# Patient Record
Sex: Female | Born: 1975
Health system: Southern US, Community
[De-identification: ages and names within clinical notes are randomized; demographics above are authoritative.]

## PROBLEM LIST (undated history)

## (undated) DIAGNOSIS — D509 Iron deficiency anemia, unspecified: Secondary | ICD-10-CM

## (undated) DIAGNOSIS — G039 Meningitis, unspecified: Secondary | ICD-10-CM

## (undated) DIAGNOSIS — F32A Depression, unspecified: Secondary | ICD-10-CM

## (undated) DIAGNOSIS — F419 Anxiety disorder, unspecified: Secondary | ICD-10-CM

## (undated) DIAGNOSIS — T7840XA Allergy, unspecified, initial encounter: Secondary | ICD-10-CM

## (undated) DIAGNOSIS — F909 Attention-deficit hyperactivity disorder, unspecified type: Secondary | ICD-10-CM

## (undated) DIAGNOSIS — S92919A Unspecified fracture of unspecified toe(s), initial encounter for closed fracture: Secondary | ICD-10-CM

## (undated) DIAGNOSIS — G43909 Migraine, unspecified, not intractable, without status migrainosus: Secondary | ICD-10-CM

## (undated) DIAGNOSIS — F329 Major depressive disorder, single episode, unspecified: Secondary | ICD-10-CM

## (undated) HISTORY — DX: Iron deficiency anemia, unspecified: D50.9

## (undated) HISTORY — DX: Major depressive disorder, single episode, unspecified: F32.9

## (undated) HISTORY — DX: Attention-deficit hyperactivity disorder, unspecified type: F90.9

## (undated) HISTORY — DX: Depression, unspecified: F32.A

## (undated) HISTORY — DX: Meningitis, unspecified: G03.9

## (undated) HISTORY — DX: Allergy, unspecified, initial encounter: T78.40XA

## (undated) HISTORY — DX: Anxiety disorder, unspecified: F41.9

## (undated) HISTORY — DX: Migraine, unspecified, not intractable, without status migrainosus: G43.909

## (undated) HISTORY — DX: Unspecified fracture of unspecified toe(s), initial encounter for closed fracture: S92.919A

---

## 2000-11-24 HISTORY — PX: BREAST SURGERY: SHX581

## 2008-04-04 ENCOUNTER — Other Ambulatory Visit: Admission: RE | Admit: 2008-04-04 | Discharge: 2008-04-04 | Payer: Self-pay | Admitting: Obstetrics and Gynecology

## 2015-10-25 ENCOUNTER — Encounter: Payer: Self-pay | Admitting: Family Medicine

## 2015-10-25 ENCOUNTER — Other Ambulatory Visit: Payer: Self-pay

## 2015-10-25 ENCOUNTER — Ambulatory Visit (INDEPENDENT_AMBULATORY_CARE_PROVIDER_SITE_OTHER): Payer: BLUE CROSS/BLUE SHIELD | Admitting: Family Medicine

## 2015-10-25 VITALS — BP 105/72 | HR 76 | Temp 98.8°F | Resp 18 | Ht 67.5 in | Wt 188.0 lb

## 2015-10-25 DIAGNOSIS — F902 Attention-deficit hyperactivity disorder, combined type: Secondary | ICD-10-CM

## 2015-10-25 DIAGNOSIS — F909 Attention-deficit hyperactivity disorder, unspecified type: Secondary | ICD-10-CM | POA: Insufficient documentation

## 2015-10-25 DIAGNOSIS — Z0000 Encounter for general adult medical examination without abnormal findings: Secondary | ICD-10-CM

## 2015-10-25 DIAGNOSIS — Z1231 Encounter for screening mammogram for malignant neoplasm of breast: Secondary | ICD-10-CM

## 2015-10-25 DIAGNOSIS — R5383 Other fatigue: Secondary | ICD-10-CM | POA: Diagnosis not present

## 2015-10-25 DIAGNOSIS — Z1239 Encounter for other screening for malignant neoplasm of breast: Secondary | ICD-10-CM

## 2015-10-25 DIAGNOSIS — Z7189 Other specified counseling: Secondary | ICD-10-CM | POA: Diagnosis not present

## 2015-10-25 DIAGNOSIS — E669 Obesity, unspecified: Secondary | ICD-10-CM | POA: Insufficient documentation

## 2015-10-25 DIAGNOSIS — Z7689 Persons encountering health services in other specified circumstances: Secondary | ICD-10-CM | POA: Insufficient documentation

## 2015-10-25 DIAGNOSIS — Z6828 Body mass index (BMI) 28.0-28.9, adult: Secondary | ICD-10-CM

## 2015-10-25 MED ORDER — AMPHETAMINE-DEXTROAMPHETAMINE 30 MG PO TABS: 30.0000 mg | ORAL_TABLET | Freq: Two times a day (BID) | ORAL | Status: DC

## 2015-10-25 MED ORDER — CITALOPRAM HYDROBROMIDE 20 MG PO TABS: 20.0000 mg | ORAL_TABLET | Freq: Every day | ORAL | Status: DC

## 2015-10-25 MED ORDER — AMPHETAMINE-DEXTROAMPHETAMINE 30 MG PO TABS
30.0000 mg | ORAL_TABLET | Freq: Two times a day (BID) | ORAL | Status: DC
Start: 2015-10-25 — End: 2015-10-25

## 2015-10-25 NOTE — Patient Instructions (Signed)
Health Maintenance, Female Adopting a healthy lifestyle and getting preventive care can go a long way to promote health and wellness. Talk with your health care provider about what schedule of regular examinations is right for you. This is a good chance for you to check in with your provider about disease prevention and staying healthy. In between checkups, there are plenty of things you can do on your own. Experts have done a lot of research about which lifestyle changes and preventive measures are most likely to keep you healthy. Ask your health care provider for more information. WEIGHT AND DIET  Eat a healthy diet  Be sure to include plenty of vegetables, fruits, low-fat dairy products, and lean protein.  Do not eat a lot of foods high in solid fats, added sugars, or salt.  Get regular exercise. This is one of the most important things you can do for your health.  Most adults should exercise for at least 150 minutes each week. The exercise should increase your heart rate and make you sweat (moderate-intensity exercise).  Most adults should also do strengthening exercises at least twice a week. This is in addition to the moderate-intensity exercise.  Maintain a healthy weight  Body mass index (BMI) is a measurement that can be used to identify possible weight problems. It estimates body fat based on height and weight. Your health care provider can help determine your BMI and help you achieve or maintain a healthy weight.  For females 20 years of age and older:   A BMI below 18.5 is considered underweight.  A BMI of 18.5 to 24.9 is normal.  A BMI of 25 to 29.9 is considered overweight.  A BMI of 30 and above is considered obese.  Watch levels of cholesterol and blood lipids  You should start having your blood tested for lipids and cholesterol at 41 years of age, then have this test every 5 years.  You may need to have your cholesterol levels checked more often if:  Your lipid  or cholesterol levels are high.  You are older than 41 years of age.  You are at high risk for heart disease.  CANCER SCREENING   Lung Cancer  Lung cancer screening is recommended for adults 55-80 years old who are at high risk for lung cancer because of a history of smoking.  A yearly low-dose CT scan of the lungs is recommended for people who:  Currently smoke.  Have quit within the past 15 years.  Have at least a 30-pack-year history of smoking. A pack year is smoking an average of one pack of cigarettes a day for 1 year.  Yearly screening should continue until it has been 15 years since you quit.  Yearly screening should stop if you develop a health problem that would prevent you from having lung cancer treatment.  Breast Cancer  Practice breast self-awareness. This means understanding how your breasts normally appear and feel.  It also means doing regular breast self-exams. Let your health care provider know about any changes, no matter how small.  If you are in your 20s or 30s, you should have a clinical breast exam (CBE) by a health care provider every 1-3 years as part of a regular health exam.  If you are 40 or older, have a CBE every year. Also consider having a breast X-ray (mammogram) every year.  If you have a family history of breast cancer, talk to your health care provider about genetic screening.  If you   are at high risk for breast cancer, talk to your health care provider about having an MRI and a mammogram every year.  Breast cancer gene (BRCA) assessment is recommended for women who have family members with BRCA-related cancers. BRCA-related cancers include:  Breast.  Ovarian.  Tubal.  Peritoneal cancers.  Results of the assessment will determine the need for genetic counseling and BRCA1 and BRCA2 testing. Cervical Cancer Your health care provider may recommend that you be screened regularly for cancer of the pelvic organs (ovaries, uterus, and  vagina). This screening involves a pelvic examination, including checking for microscopic changes to the surface of your cervix (Pap test). You may be encouraged to have this screening done every 3 years, beginning at age 21.  For women ages 30-65, health care providers may recommend pelvic exams and Pap testing every 3 years, or they may recommend the Pap and pelvic exam, combined with testing for human papilloma virus (HPV), every 5 years. Some types of HPV increase your risk of cervical cancer. Testing for HPV may also be done on women of any age with unclear Pap test results.  Other health care providers may not recommend any screening for nonpregnant women who are considered low risk for pelvic cancer and who do not have symptoms. Ask your health care provider if a screening pelvic exam is right for you.  If you have had past treatment for cervical cancer or a condition that could lead to cancer, you need Pap tests and screening for cancer for at least 20 years after your treatment. If Pap tests have been discontinued, your risk factors (such as having a new sexual partner) need to be reassessed to determine if screening should resume. Some women have medical problems that increase the chance of getting cervical cancer. In these cases, your health care provider may recommend more frequent screening and Pap tests. Colorectal Cancer  This type of cancer can be detected and often prevented.  Routine colorectal cancer screening usually begins at 41 years of age and continues through 41 years of age.  Your health care provider may recommend screening at an earlier age if you have risk factors for colon cancer.  Your health care provider may also recommend using home test kits to check for hidden blood in the stool.  A small camera at the end of a tube can be used to examine your colon directly (sigmoidoscopy or colonoscopy). This is done to check for the earliest forms of colorectal  cancer.  Routine screening usually begins at age 50.  Direct examination of the colon should be repeated every 5-10 years through 41 years of age. However, you may need to be screened more often if early forms of precancerous polyps or small growths are found. Skin Cancer  Check your skin from head to toe regularly.  Tell your health care provider about any new moles or changes in moles, especially if there is a change in a mole's shape or color.  Also tell your health care provider if you have a mole that is larger than the size of a pencil eraser.  Always use sunscreen. Apply sunscreen liberally and repeatedly throughout the day.  Protect yourself by wearing long sleeves, pants, a wide-brimmed hat, and sunglasses whenever you are outside. HEART DISEASE, DIABETES, AND HIGH BLOOD PRESSURE   High blood pressure causes heart disease and increases the risk of stroke. High blood pressure is more likely to develop in:  People who have blood pressure in the high end   of the normal range (130-139/85-89 mm Hg).  People who are overweight or obese.  People who are African American.  If you are 38-23 years of age, have your blood pressure checked every 3-5 years. If you are 61 years of age or older, have your blood pressure checked every year. You should have your blood pressure measured twice--once when you are at a hospital or clinic, and once when you are not at a hospital or clinic. Record the average of the two measurements. To check your blood pressure when you are not at a hospital or clinic, you can use:  An automated blood pressure machine at a pharmacy.  A home blood pressure monitor.  If you are between 45 years and 39 years old, ask your health care provider if you should take aspirin to prevent strokes.  Have regular diabetes screenings. This involves taking a blood sample to check your fasting blood sugar level.  If you are at a normal weight and have a low risk for diabetes,  have this test once every three years after 41 years of age.  If you are overweight and have a high risk for diabetes, consider being tested at a younger age or more often. PREVENTING INFECTION  Hepatitis B  If you have a higher risk for hepatitis B, you should be screened for this virus. You are considered at high risk for hepatitis B if:  You were born in a country where hepatitis B is common. Ask your health care provider which countries are considered high risk.  Your parents were born in a high-risk country, and you have not been immunized against hepatitis B (hepatitis B vaccine).  You have HIV or AIDS.  You use needles to inject street drugs.  You live with someone who has hepatitis B.  You have had sex with someone who has hepatitis B.  You get hemodialysis treatment.  You take certain medicines for conditions, including cancer, organ transplantation, and autoimmune conditions. Hepatitis C  Blood testing is recommended for:  Everyone born from [birth year] through 1967.  Anyone with known risk factors for hepatitis C. Sexually transmitted infections (STIs)  You should be screened for sexually transmitted infections (STIs) including gonorrhea and chlamydia if:  You are sexually active and are younger than 41 years of age.  You are older than 41 years of age and your health care provider tells you that you are at risk for this type of infection.  Your sexual activity has changed since you were last screened and you are at an increased risk for chlamydia or gonorrhea. Ask your health care provider if you are at risk.  If you do not have HIV, but are at risk, it may be recommended that you take a prescription medicine daily to prevent HIV infection. This is called pre-exposure prophylaxis (PrEP). You are considered at risk if:  You are sexually active and do not regularly use condoms or know the HIV status of your partner(s).  You take drugs by injection.  You are sexually  active with a partner who has HIV. Talk with your health care provider about whether you are at high risk of being infected with HIV. If you choose to begin PrEP, you should first be tested for HIV. You should then be tested every 3 months for as long as you are taking PrEP.  PREGNANCY   If you are premenopausal and you may become pregnant, ask your health care provider about preconception counseling.  If you may  become pregnant, take 400 to 800 micrograms (mcg) of folic acid every day.  If you want to prevent pregnancy, talk to your health care provider about birth control (contraception). OSTEOPOROSIS AND MENOPAUSE   Osteoporosis is a disease in which the bones lose minerals and strength with aging. This can result in serious bone fractures. Your risk for osteoporosis can be identified using a bone density scan.  If you are 73 years of age or older, or if you are at risk for osteoporosis and fractures, ask your health care provider if you should be screened.  Ask your health care provider whether you should take a calcium or vitamin D supplement to lower your risk for osteoporosis.  Menopause may have certain physical symptoms and risks.  Hormone replacement therapy may reduce some of these symptoms and risks. Talk to your health care provider about whether hormone replacement therapy is right for you.  HOME CARE INSTRUCTIONS   Schedule regular health, dental, and eye exams.  Stay current with your immunizations.   Do not use any tobacco products including cigarettes, chewing tobacco, or electronic cigarettes.  If you are pregnant, do not drink alcohol.  If you are breastfeeding, limit how much and how often you drink alcohol.  Limit alcohol intake to no more than 1 drink per day for nonpregnant women. One drink equals 12 ounces of beer, 5 ounces of wine, or 1 ounces of hard liquor.  Do not use street drugs.  Do not share needles.  Ask your health care provider for help if  you need support or information about quitting drugs.  Tell your health care provider if you often feel depressed.  Tell your health care provider if you have ever been abused or do not feel safe at home.   This information is not intended to replace advice given to you by your health care provider. Make sure you discuss any questions you have with your health care provider.   Document Released: 05/26/2011 Document Revised: 12/01/2014 Document Reviewed: 10/12/2013 Elsevier Interactive Patient Education 2016 [place] tomorrow, if normal will see in 3 months for ADHD.  mammogram ordered

## 2015-10-25 NOTE — Progress Notes (Signed)
Subjective:    Patient ID: Jenny Little, female    DOB: 11/02/1976, 39 y.o.   MRN: [id number]  HPI   Patient presents for new patient establishment with complaints of fatigue.. All past medical history, surgical history, allergies, family history, immunizations and social history was obtained from the patient today and entered into the electronic medical record. Records are requested from her prior PCP, and will be reviewed at the time they are received. All medical records will be updated at that time.  Health maintenance:  Colonoscopy: Family history maternal grandmother at age 41. Early screening indicated. Mammogram: Family history of breast cancer, patient is establishing with gynecology. Mammogram desired.  Cervical cancer screening: Patient is establishing with gynecology. Last Pap smear 2015. No abnormal Paps.  Immunizations: Immunizations unknown, records have been requested. Infectious disease screening: HIV screening unknown, records have been requested.  ADHD: Patient states that she was started on ADHD medications at age 89. She reports taking Adderall 30 mg twice a day. She states she was diagnosed with ADHD in eighth grade, but her mother never wanted her to be medicated. She initially was tried on Vyvanse, but secondary to insurance coverage this was discontinued and she was started on Adderall. She states she has no side effects to the Adderall, and she feels she benefits a great deal with the use of Adderall. Patients pharmacy was contacted for dose verification and last refill was 09/30/2015.  Fatigue: Patient reports increased fatigue over the last 2 weeks. She is starting a new job, and moved to a new town and home. She admits that she has not been exercising as frequently as she has before secondary to her schedule. She finds great benefit and spiritual study and she has been unable to do this as well. She still traveling back and forth to [place]. She states she has good  social support her family. She does feel guilty about relaxing and taking time for herself. She is helping care for a family member as well.    Past Medical History  Diagnosis Date  . Allergy   . Anxiety   . ADHD (attention deficit hyperactivity disorder)   . Migraine    No Known Allergies Past Surgical History  Procedure Laterality Date  . Breast surgery  2002    implants (saline)   Family History  Problem Relation Age of Onset  . Anuerysm Father [id number]    died   . Heart disease Father   . Diabetes Father   . Alcohol abuse Father   . Mental illness Father   . Hypertension Brother   . Mental illness Maternal Uncle   . Colon cancer Maternal Grandmother 40    died of colon cancer  . Early death Maternal Grandmother   . Colon cancer Paternal Grandmother [id number]    metz to bone  . Early death Paternal Uncle   . Heart disease Paternal Uncle   . Heart disease Maternal Grandfather   . Stroke Paternal Grandfather    Social History   Social History  . Marital Status: Married    Spouse Name: N/A  . Number of Children: N/A  . Years of Education: N/A   Occupational History  . Not on file.   Social History Main Topics  . Smoking status: Never Smoker   . Smokeless tobacco: Never Used  . Alcohol Use: No  . Drug Use: No  . Sexual Activity: Yes     Comment: female    Other Topics Concern  .  Not on file   Social History Narrative   Married. Husband's name Onalee Hua. 2 children, Trinna Post and Marden Noble.   Bachelor's in social work. Employed full-time as a Customer service manager.   Moved to [facility] November 2016.   Takes a daily vitamin, wears her seatbelt, wears a bike helmet, exercises at least 3 times a week.   Smoke detector located in the home.   No firearms in the home.   Patient feels safe in her relationship.   Review of Systems Negative, with the exception of above mentioned in HPI     Objective:   Physical Exam BP 105/72 mmHg  Pulse 76  Temp(Src) 98.8 F (37.1 C) (Temporal)   Resp 18  Ht 5' 7.5" (1.715 m)  Wt 188 lb (85.276 kg)  BMI 28.99 kg/m2  SpO2 100%  LMP 10/20/2015 Gen: Afebrile. No acute distress. Nontoxic in appearance, well-developed, well-nourished, Caucasian female. Pleasant. HENT: AT. [place]. Bilateral TM visualized and normal in appearance. MMM, no oral lesions. Bilateral nares without erythema or swelling. Throat without erythema or exudates. No cough on exam. No hoarseness on exam. Good dentition. Eyes:Pupils Equal Round Reactive to light, Extraocular movements intact,  Conjunctiva without redness, discharge or icterus. Neck/lymp/endocrine: Supple, no lymphadenopathy, no thyromegaly CV: RRR no murmur appreciated, no clicks gallops or rubs, no edema, +2/4 P posterior tibialis pulses. No JVD, no carotid bruits. Chest: CTAB, no wheeze or crackles Abd: Soft. Flat. NTND. BS present. No Masses palpated. No rebound tenderness, no guarding, no hepatosplenomegaly MSK: No erythema, no soft tissue swelling, no obvious deformities, full range of motion, 5/5 upper and lower extremity strength. Skin: No rashes, purpura or petechiae.  Neuro:  Normal gait. PERLA. EOMi. Alert. Oriented x3  Psych: Normal affect, dress and demeanor. Normal speech. Normal thought content and judgment.    Assessment & Plan:  [name] is a 39 y.o. female presents for establishment of care and preventive exam.  Attention deficit hyperactivity disorder (ADHD), combined type - Doses verified, Adderall 30 mg twice a day prescribed for 3 months, prescription starting 10/31/2015. - Follow-up 3 months  Other fatigue - Patient encouraged to take time out for herself to relax, do yoga and reconnect spiritually. Fatigue possibly secondary to burden of needing, starting a new job in taking care of the family. - Vitamin D, TSH future orders, B 12  Encounter for preventive health examination - CBC, CMP, A1c, fasting lipids Health maintenance:  Colonoscopy: Family history maternal grandmother  at age 45. Early screening indicated. Patient to be screened early, and be vigilant on any bowel changes. Mammogram: Family history of breast cancer, patient is establishing with gynecology. Mammogram ordered today. Cervical cancer screening: Patient is establishing with gynecology. Last Pap smear 2015. No abnormal Paps. Continue cervical cancer screening with gynecology. Immunizations: Immunizations unknown, records have been requested. Infectious disease screening: HIV screening unknown, records have been requested.  F/U 3 months.

## 2015-10-25 NOTE — Progress Notes (Signed)
Pre visit review using our clinic review tool, if applicable. No additional management support is needed unless otherwise documented below in the visit note. 

## 2015-10-26 ENCOUNTER — Other Ambulatory Visit (INDEPENDENT_AMBULATORY_CARE_PROVIDER_SITE_OTHER): Payer: BLUE CROSS/BLUE SHIELD

## 2015-10-26 DIAGNOSIS — Z7189 Other specified counseling: Secondary | ICD-10-CM | POA: Diagnosis not present

## 2015-10-26 DIAGNOSIS — Z6828 Body mass index (BMI) 28.0-28.9, adult: Secondary | ICD-10-CM

## 2015-10-26 DIAGNOSIS — F902 Attention-deficit hyperactivity disorder, combined type: Secondary | ICD-10-CM | POA: Diagnosis not present

## 2015-10-26 DIAGNOSIS — R5383 Other fatigue: Secondary | ICD-10-CM | POA: Diagnosis not present

## 2015-10-26 DIAGNOSIS — Z7689 Persons encountering health services in other specified circumstances: Secondary | ICD-10-CM

## 2015-10-26 DIAGNOSIS — Z1239 Encounter for other screening for malignant neoplasm of breast: Secondary | ICD-10-CM

## 2015-10-26 DIAGNOSIS — Z0000 Encounter for general adult medical examination without abnormal findings: Secondary | ICD-10-CM

## 2015-10-26 LAB — COMPREHENSIVE METABOLIC PANEL
ALK PHOS: 52 U/L (ref 39–117)
ALT: 15 U/L (ref 0–35)
AST: 18 U/L (ref 0–37)
Albumin: 4 g/dL (ref 3.5–5.2)
BUN: 13 mg/dL (ref 6–23)
CHLORIDE: 105 meq/L (ref 96–112)
CO2: 24 mEq/L (ref 19–32)
CREATININE: 0.65 mg/dL (ref 0.40–1.20)
Calcium: 8.8 mg/dL (ref 8.4–10.5)
GFR: 106.68 mL/min (ref >60.00)
GLUCOSE: 92 mg/dL (ref 70–99)
POTASSIUM: 4.5 meq/L (ref 3.5–5.1)
SODIUM: 138 meq/L (ref 135–145)
Total Bilirubin: 0.3 mg/dL (ref 0.2–1.2)
Total Protein: 6.8 g/dL (ref 6.0–8.3)

## 2015-10-26 LAB — CBC WITH DIFFERENTIAL/PLATELET
Basophils Absolute: 0 10*3/uL (ref 0.0–0.1)
Basophils Relative: 0.4 % (ref 0.0–3.0)
EOS ABS: 0.2 10*3/uL (ref 0.0–0.7)
Eosinophils Relative: 2.7 % (ref 0.0–5.0)
HCT: 33.5 % — ABNORMAL LOW (ref 36.0–46.0)
HEMOGLOBIN: 10.5 g/dL — AB (ref 12.0–15.0)
LYMPHS PCT: 31.8 % (ref 12.0–46.0)
Lymphs Abs: 2 10*3/uL (ref 0.7–4.0)
MCHC: 31.3 g/dL (ref 30.0–36.0)
MCV: 76.4 fl — ABNORMAL LOW (ref 78.0–100.0)
MONO ABS: 0.4 10*3/uL (ref 0.1–1.0)
Monocytes Relative: 5.9 % (ref 3.0–12.0)
Neutro Abs: 3.7 10*3/uL (ref 1.4–7.7)
Neutrophils Relative %: 59.2 % (ref 43.0–77.0)
Platelets: 336 10*3/uL (ref 150.0–400.0)
RBC: 4.38 Mil/uL (ref 3.87–5.11)
RDW: 16.8 % — ABNORMAL HIGH (ref 11.5–15.5)
WBC: 6.2 10*3/uL (ref 4.0–10.5)

## 2015-10-26 LAB — LIPID PANEL
CHOLESTEROL: 212 mg/dL — AB (ref 0–200)
HDL: 45.5 mg/dL (ref >39.00)
LDL CALC: 139 mg/dL — AB (ref 0–99)
NONHDL: 166.74
Total CHOL/HDL Ratio: 5
Triglycerides: 137 mg/dL (ref 0.0–149.0)
VLDL: 27.4 mg/dL (ref 0.0–40.0)

## 2015-10-26 LAB — VITAMIN B12: VITAMIN B 12: 378 pg/mL (ref 211–911)

## 2015-10-26 LAB — HEMOGLOBIN A1C: HEMOGLOBIN A1C: 6.1 % (ref 4.6–6.5)

## 2015-10-26 LAB — VITAMIN D 25 HYDROXY (VIT D DEFICIENCY, FRACTURES): VITD: 34.28 ng/mL (ref 30.00–100.00)

## 2015-10-26 LAB — TSH: TSH: 1.25 u[IU]/mL (ref 0.35–4.50)

## 2015-10-29 ENCOUNTER — Telehealth: Payer: Self-pay | Admitting: Family Medicine

## 2015-10-29 NOTE — Telephone Encounter (Signed)
Please call pt concerning her labs: - Pt has mildly elevated cholesterol (212 total, 139 LDL). She should be encouraged to increase exercise >150 minutes a week, make healthy food choices with fresh fruit and veggies. She could consider taking a fish oil supplemntation as well.  - She is mildly anemic (low hemoglobin). I do not see this in her history list. Considering she is having fatigue, she should make a f/u appt to discuss in detail, with additional testing at that time to figure out why she is has anemia.  - her a1c looking for diabetes is mildly elevated (6.1%) this places her at increased risk to develop diabetes and is consider "prediabetes". Changes to her diet and exercise (as discussed above) will decrease her chances of developing diabetes as well.  - lastly her vitamin D and vit B are low normal. She should be encouraged to take 600 u of Vit D daily (OTC) and an OTC B12 daily. Both of these supplementations can help decrease her fatigue.  - All other lab work is normal.   1. Needs appt to discuss anemia in more detail

## 2015-10-29 NOTE — Telephone Encounter (Signed)
Spoke with patient regarding lab results and instructions patient verbalized understanding. Patient scheduled follow up appt.

## 2015-11-02 ENCOUNTER — Encounter: Payer: Self-pay | Admitting: Family Medicine

## 2015-11-13 ENCOUNTER — Ambulatory Visit (INDEPENDENT_AMBULATORY_CARE_PROVIDER_SITE_OTHER): Payer: BLUE CROSS/BLUE SHIELD | Admitting: Family Medicine

## 2015-11-13 ENCOUNTER — Encounter: Payer: Self-pay | Admitting: Family Medicine

## 2015-11-13 VITALS — BP 115/77 | HR 71 | Temp 98.1°F | Resp 20 | Wt 187.2 lb

## 2015-11-13 DIAGNOSIS — R5383 Other fatigue: Secondary | ICD-10-CM

## 2015-11-13 DIAGNOSIS — D649 Anemia, unspecified: Secondary | ICD-10-CM

## 2015-11-13 LAB — CBC WITH DIFFERENTIAL/PLATELET
Basophils Absolute: 0 10*3/uL (ref 0.0–0.1)
Basophils Relative: 0.5 % (ref 0.0–3.0)
EOS PCT: 3.7 % (ref 0.0–5.0)
Eosinophils Absolute: 0.2 10*3/uL (ref 0.0–0.7)
HCT: 34 % — ABNORMAL LOW (ref 36.0–46.0)
HEMOGLOBIN: 10.6 g/dL — AB (ref 12.0–15.0)
LYMPHS ABS: 2.2 10*3/uL (ref 0.7–4.0)
Lymphocytes Relative: 33.5 % (ref 12.0–46.0)
MCHC: 31.1 g/dL (ref 30.0–36.0)
MCV: 76.3 fl — AB (ref 78.0–100.0)
MONOS PCT: 7.5 % (ref 3.0–12.0)
Monocytes Absolute: 0.5 10*3/uL (ref 0.1–1.0)
NEUTROS PCT: 54.8 % (ref 43.0–77.0)
Neutro Abs: 3.7 10*3/uL (ref 1.4–7.7)
Platelets: 354 10*3/uL (ref 150.0–400.0)
RBC: 4.46 Mil/uL (ref 3.87–5.11)
RDW: 17 % — ABNORMAL HIGH (ref 11.5–15.5)
WBC: 6.7 10*3/uL (ref 4.0–10.5)

## 2015-11-13 LAB — RETICULOCYTES
ABS RETIC: 31.3 10*3/uL (ref 19.0–186.0)
RBC.: 4.47 MIL/uL (ref 3.87–5.11)
Retic Ct Pct: 0.7 % (ref 0.4–2.3)

## 2015-11-13 LAB — IRON AND TIBC
%SAT: 6 % — ABNORMAL LOW (ref 11–50)
Iron: 25 ug/dL — ABNORMAL LOW (ref 40–190)
TIBC: 451 ug/dL — AB (ref 250–450)
UIBC: 426 ug/dL — ABNORMAL HIGH (ref 125–400)

## 2015-11-13 LAB — FOLATE: FOLATE: 13.5 ng/mL (ref >5.9)

## 2015-11-13 LAB — FERRITIN: FERRITIN: 4.1 ng/mL — AB (ref 10.0–291.0)

## 2015-11-13 NOTE — Patient Instructions (Signed)
Iron Deficiency Anemia, Adult Anemia is a condition in which there are less red blood cells or hemoglobin in the blood than normal. Hemoglobin is the part of red blood cells that carries oxygen. Iron deficiency anemia is anemia caused by too little iron. It is the most common type of anemia. It may leave you tired and short of breath. CAUSES   Lack of iron in the diet.  Poor absorption of iron, as seen with intestinal disorders.  Intestinal bleeding.  Heavy periods. SIGNS AND SYMPTOMS  Mild anemia may not be noticeable. Symptoms may include:  Fatigue.  Headache.  Pale skin.  Weakness.  Tiredness.  Shortness of breath.  Dizziness.  Cold hands and feet.  Fast or irregular heartbeat. DIAGNOSIS  Diagnosis requires a thorough evaluation and physical exam by your health care provider. Blood tests are generally used to confirm iron deficiency anemia. Additional tests may be done to find the underlying cause of your anemia. These may include:  Testing for blood in the stool (fecal occult blood test).  A procedure to see inside the colon and rectum (colonoscopy).  A procedure to see inside the esophagus and stomach (endoscopy). TREATMENT  Iron deficiency anemia is treated by correcting the cause of the deficiency. Treatment may involve:  Adding iron-rich foods to your diet.  Taking iron supplements. Pregnant or breastfeeding women need to take extra iron because their normal diet usually does not provide the required amount.  Taking vitamins. Vitamin C improves the absorption of iron. Your health care provider may recommend that you take your iron tablets with a glass of orange juice or vitamin C supplement.  Medicines to make heavy menstrual flow lighter.  Surgery. HOME CARE INSTRUCTIONS   Take iron as directed by your health care provider.  If you cannot tolerate taking iron supplements by mouth, talk to your health care provider about taking them through a vein  (intravenously) or an injection into a muscle.  For the best iron absorption, iron supplements should be taken on an empty stomach. If you cannot tolerate them on an empty stomach, you may need to take them with food.  Do not drink milk or take antacids at the same time as your iron supplements. Milk and antacids may interfere with the absorption of iron.  Iron supplements can cause constipation. Make sure to include fiber in your diet to prevent constipation. A stool softener may also be recommended.  Take vitamins as directed by your health care provider.  Eat a diet rich in iron. Foods high in iron include liver, lean beef, whole-grain bread, eggs, dried fruit, and dark green leafy vegetables. SEEK IMMEDIATE MEDICAL CARE IF:   You faint. If this happens, do not drive. Call your local emergency services (911 in [place] if no other help is available.  You have chest pain.  You feel nauseous or vomit.  You have severe or increased shortness of breath with activity.  You feel weak.  You have a rapid heartbeat.  You have unexplained sweating.  You become light-headed when getting up from a chair or bed. MAKE SURE YOU:   Understand these instructions.  Will watch your condition.  Will get help right away if you are not doing well or get worse.   This information is not intended to replace advice given to you by your health care provider. Make sure you discuss any questions you have with your health care provider.   Document Released: 11/07/2000 Document Revised: 12/01/2014 Document Reviewed: 07/18/2013 Elsevier   Interactive Patient Education [employer]2016 [employer]  I will call you with your results. We may need to supplement iron.

## 2015-11-13 NOTE — Progress Notes (Signed)
Subjective:    Patient ID: Jenny Little, female    DOB: 07/09/1976, 39 y.o.   MRN: [id number]  HPI  Vitamin D deficiency: Patient presented for fatigue approximately 2 weeks ago found to have low vitamin D. Patient reports taking daily vitamin D of unknown dose. Discussed with patient today consequences of low vitamin D in the supplementation. Vitamin D 34.2 Anemia, microcytic: Patient presents with what is presumed to be new onset microcytic anemia. Prior records do not show prior history of anemia. Patient had been more fatigued upon presentation approximately 2 weeks ago. She does endorse having a heavy menses that month. She states her menses does occur monthly, but approximately every other month she has a heavy menses. She reports eating and occasional blood clot, but that is not routine. She does endorse dizziness at times, but not routinely. She denies chest pain, shortness of breath, lower extremity edema. She does endorse palpitations intermittently. She does not have a known history of uterine fibroids. She does have a family history of colon cancer. Patient denies any bowel changes in color or caliber, melena or hematochezia. Patient was found to have B-12 378. The globe and 10.5, hematocrit 33.4 MCV 76.4.  Past Medical History  Diagnosis Date  . Allergy   . Anxiety   . ADHD (attention deficit hyperactivity disorder)   . Migraine   . Fracture, toe   . Depression    No Known Allergies  Social History   Social History  . Marital Status: Married    Spouse Name: N/A  . Number of Children: N/A  . Years of Education: N/A   Occupational History  . Not on file.   Social History Main Topics  . Smoking status: Never Smoker   . Smokeless tobacco: Never Used  . Alcohol Use: No  . Drug Use: No  . Sexual Activity: Yes     Comment: female    Other Topics Concern  . Not on file   Social History Narrative   Married. Husband's name Onalee HuaDavid. 2 children, Trinna PostAlex and Marden NobleKemp.   Bachelor's in social work. Employed full-time as a Customer service managerreal estate agent.   [place]   Takes a daily vitamin, wears her seatbelt, wears a bike helmet, exercises at least 3 times a week.   Smoke detector located in the home.   No firearms in the home.   Patient feels safe in her relationship.    Review of Systems Negative, with the exception of above mentioned in HPI\    Objective:   Physical Exam BP 115/77 mmHg  Pulse 71  Temp(Src) 98.1 F (36.7 C) (Oral)  Resp 20  Wt 187 lb 4 oz (84.936 kg)  SpO2 100%  LMP 10/20/2015 Gen: Afebrile. No acute distress. Nontoxic in appearance, well-developed, well-nourished, Caucasian female. Very pleasant. HENT: AT. [place].  MMM.  Eyes:Pupils Equal Round Reactive to light, Extraocular movements intact,  Conjunctiva without redness, discharge or icterus. CV: RRR  Chest: CTAB, no wheeze or crackles Skin: No rashes, purpura or petechiae.  Neuro: Normal gait. PERLA. EOMi. Alert. Oriented x3    Assessment & Plan:  1. Anemia, unspecified anemia type - anemia panel to further evaluate new-onset microcytic anemia. Hemoccult cards provided to patient today with instructions. Patient does have a family history of colon cancer at age 41 and her maternal grandmother. Currently patient denies any bowel changes. She does endorse heavy menses. - Folate - Ferritin - Iron Binding Cap (TIBC) - Retic - CBC w/Diff -  Hemoccult Cards (X3 cards); Future  2. Other fatigue - Possibly multifactorial. TSH was normal. Low normal vitamin D and low normal B-12. Supplementing both vitamin D and B 12. - Anemia panel and Hemoccult cards provided to patient today.  - Follow-up depending upon lab work.

## 2015-11-14 ENCOUNTER — Ambulatory Visit
Admission: RE | Admit: 2015-11-14 | Discharge: 2015-11-14 | Disposition: A | Discharge status: Home / Self Care | Payer: BLUE CROSS/BLUE SHIELD | Source: Ambulatory Visit

## 2015-11-14 ENCOUNTER — Telehealth: Payer: Self-pay | Admitting: Family Medicine

## 2015-11-14 DIAGNOSIS — Z1231 Encounter for screening mammogram for malignant neoplasm of breast: Secondary | ICD-10-CM

## 2015-11-14 MED ORDER — FERROUS SULFATE 325 (65 FE) MG PO TABS: 325.0000 mg | ORAL_TABLET | Freq: Three times a day (TID) | ORAL | Status: DC

## 2015-11-14 NOTE — Telephone Encounter (Signed)
Spoke with patient reviewed lab results and instructions. Patient verbalized understanding. 

## 2015-11-14 NOTE — Telephone Encounter (Signed)
Please call pt: - her labs resulted with severely low iron. We need to supplement not only in her diet, but with medication. We discussed in her appt some people experience constipation with iron supplement. She should be encouraged to take a stool softner or miralax daily. This is likely a medication she will need daily even after we get her levels back to normal (just maybe not as many doses in a day).  - She needs to complete the hemoccult cards as directed as well.  - Will see her at the 6 mos f/u unless she occurs problems or her hemoccult cards are abnormal.  - f/u April/May should be scheduled for prediabetes/anemia (with labs).

## 2015-12-03 ENCOUNTER — Other Ambulatory Visit: Payer: BLUE CROSS/BLUE SHIELD

## 2015-12-03 ENCOUNTER — Telehealth: Payer: Self-pay | Admitting: Family Medicine

## 2015-12-03 DIAGNOSIS — D649 Anemia, unspecified: Secondary | ICD-10-CM

## 2015-12-03 LAB — HEMOCCULT SLIDES (X 3 CARDS)
FECAL OCCULT BLD: NEGATIVE
OCCULT 1: NEGATIVE
OCCULT 2: NEGATIVE
OCCULT 3: NEGATIVE
OCCULT 4: NEGATIVE
OCCULT 5: NEGATIVE

## 2015-12-03 NOTE — Telephone Encounter (Signed)
All hemoccult cards were negative.

## 2015-12-04 NOTE — Telephone Encounter (Signed)
Spoke with patient reviewed results. 

## 2016-01-07 ENCOUNTER — Ambulatory Visit (INDEPENDENT_AMBULATORY_CARE_PROVIDER_SITE_OTHER): Payer: BLUE CROSS/BLUE SHIELD | Admitting: Family Medicine

## 2016-01-07 ENCOUNTER — Encounter: Payer: Self-pay | Admitting: Family Medicine

## 2016-01-07 VITALS — BP 121/81 | HR 83 | Temp 98.9°F | Resp 20 | Wt 192.2 lb

## 2016-01-07 DIAGNOSIS — J0100 Acute maxillary sinusitis, unspecified: Secondary | ICD-10-CM | POA: Diagnosis not present

## 2016-01-07 MED ORDER — AMOXICILLIN-POT CLAVULANATE 875-125 MG PO TABS: 1.0000 | ORAL_TABLET | Freq: Two times a day (BID) | ORAL | Status: DC

## 2016-01-07 MED ORDER — AMPHETAMINE-DEXTROAMPHETAMINE 30 MG PO TABS: 30.0000 mg | ORAL_TABLET | Freq: Two times a day (BID) | ORAL | Status: DC

## 2016-01-07 NOTE — Progress Notes (Signed)
Patient ID: Jenny Little, female   DOB: [birth year], 40 y.o.   MRN: [id number]    Jenny Little , 1976-09-17, 40 y.o., female MRN: [id number]  CC: fatigue Subjective: Pt presents for an acute OV with complaints of fatigue of 2 weeks duration. Associated symptoms include sneezing, weakness, nasal congestion, rhinorrhea, sore throat and headache. Son also ill. She denies nausea, vomit, diarrhea or rash. Tolerating PO.  OTC: Cold and flu, dayquil, allergy medicine.  UTD with Tdap.  No Known Allergies Social History  Substance Use Topics  . Smoking status: Never Smoker   . Smokeless tobacco: Never Used  . Alcohol Use: No   Past Medical History  Diagnosis Date  . Allergy   . Anxiety   . ADHD (attention deficit hyperactivity disorder)   . Migraine   . Fracture, toe   . Depression    Past Surgical History  Procedure Laterality Date  . Breast surgery  2002    implants (saline)   Family History  Problem Relation Age of Onset  . Anuerysm Father 13    died   . Heart disease Father   . Diabetes Father   . Alcohol abuse Father   . Mental illness Father   . Hypertension Brother   . Mental illness Maternal Uncle   . Colon cancer Maternal Grandmother 40    died of colon cancer  . Early death Maternal Grandmother   . Colon cancer Paternal Grandmother 38    metz to bone  . Early death Paternal Uncle   . Heart disease Paternal Uncle   . Heart disease Maternal Grandfather   . Stroke Paternal Grandfather      Medication List       This list is accurate as of: 01/07/16  1:58 PM.  Always use your most recent med list.               amphetamine-dextroamphetamine 30 MG tablet  Commonly known as:  ADDERALL  Take 1 tablet by mouth 2 (two) times daily.     cholecalciferol 1000 units tablet  Commonly known as:  VITAMIN D  Take 1,000 Units by mouth daily.     citalopram 20 MG tablet  Commonly known as:  CELEXA  Take 1 tablet (20 mg total) by mouth daily.     ferrous sulfate  325 (65 FE) MG tablet  Commonly known as:  FERROUSUL  Take 1 tablet (325 mg total) by mouth 3 (three) times daily with meals.     VITAMIN B 12 PO  Take by mouth.       ROS: Negative, with the exception of above mentioned in HPI  Objective:  BP 121/81 mmHg  Pulse 83  Temp(Src) 98.9 F (37.2 C)  Resp 20  Wt 192 lb 4 oz (87.204 kg)  SpO2 96%  LMP 12/31/2015 Body mass index is 29.65 kg/(m^2). Gen: Afebrile. No acute distress. Nontoxic in appearance.  HENT: AT. [place]. Bilateral TM visualized shiny/full. MMM, no oral lesions. Bilateral nares with erythema and dried blood. Throat without erythema or exudates. No cough on exam, TTP bilateral max sinus.  Eyes:Pupils Equal Round Reactive to light, Extraocular movements intact,  Conjunctiva without redness, discharge or icterus. Neck/lymp/endocrine: Supple, shotty anterior cervical  lymphadenopathy CV: RRR  Chest: CTAB, no wheeze or crackles. Good air movement, normal resp effort.  Abd: Soft. NTND. BS present  Neuro: Normal gait. PERLA. EOMi. Alert. Oriented x3  Assessment/Plan: Jenny Little is a 40 y.o. female present for acute OV for  1. Acute maxillary sinusitis, recurrence not specified - flonase, rest, hydrate, mucinex - amoxicillin-clavulanate (AUGMENTIN) 875-125 MG tablet; Take 1 tablet by mouth 2 (two) times daily.  Dispense: 20 tablet; Refill: 0 - F/U as needed.  2. ADHD:  refills x1 today, follow-up in 1 month to get her to March appt with B12/Vit D follow up  F/U March b12, vit d and ADHD  Felix Pacini, DO  [place]- OR

## 2016-01-07 NOTE — Patient Instructions (Signed)

## 2016-02-11 ENCOUNTER — Ambulatory Visit: Payer: BLUE CROSS/BLUE SHIELD | Admitting: Family Medicine

## 2016-02-14 ENCOUNTER — Encounter: Payer: Self-pay | Admitting: Family Medicine

## 2016-02-14 ENCOUNTER — Ambulatory Visit (INDEPENDENT_AMBULATORY_CARE_PROVIDER_SITE_OTHER): Payer: BLUE CROSS/BLUE SHIELD | Admitting: Family Medicine

## 2016-02-14 ENCOUNTER — Encounter (INDEPENDENT_AMBULATORY_CARE_PROVIDER_SITE_OTHER): Payer: Self-pay

## 2016-02-14 VITALS — BP 105/72 | HR 88 | Temp 98.6°F | Resp 16 | Ht 67.5 in | Wt 194.5 lb

## 2016-02-14 DIAGNOSIS — J029 Acute pharyngitis, unspecified: Secondary | ICD-10-CM | POA: Diagnosis not present

## 2016-02-14 NOTE — Addendum Note (Signed)
Addended by: Smitty KnudsenSUTHERLAND, Burnett Lieber K on: 02/14/2016 02:30 PM   Modules accepted: Kipp BroodSmartSet

## 2016-02-14 NOTE — Progress Notes (Signed)
OFFICE VISIT  02/14/2016   CC:  Chief Complaint  Patient presents with  . Sore Throat    x 2 days  . Fatigue   HPI:    Patient is a 40 y.o. Caucasian female who presents for sore throat. Onset 2 d/a ST and no runny nose or cough.  Felt fatigued a day or so prior to onset of ST and continues to feel fatigued. No fever.  No body aches but + HA.  No rash.  Her son is currently home with a viral URI.   Past Medical History  Diagnosis Date  . Allergy   . Anxiety   . ADHD (attention deficit hyperactivity disorder)   . Migraine   . Fracture, toe   . Depression     Past Surgical History  Procedure Laterality Date  . Breast surgery  2002    implants (saline)    Outpatient Prescriptions Prior to Visit  Medication Sig Dispense Refill  . amphetamine-dextroamphetamine (ADDERALL) 30 MG tablet Take 1 tablet by mouth 2 (two) times daily. 60 tablet 0  . cholecalciferol (VITAMIN D) 1000 units tablet Take 1,000 Units by mouth daily.    . citalopram (CELEXA) 20 MG tablet Take 1 tablet (20 mg total) by mouth daily. 90 tablet 1  . Cyanocobalamin (VITAMIN B 12 PO) Take by mouth.    . ferrous sulfate (FERROUSUL) 325 (65 FE) MG tablet Take 1 tablet (325 mg total) by mouth 3 (three) times daily with meals. 90 tablet 1  . amoxicillin-clavulanate (AUGMENTIN) 875-125 MG tablet Take 1 tablet by mouth 2 (two) times daily. (Patient not taking: Reported on 02/14/2016) 20 tablet 0   No facility-administered medications prior to visit.    No Known Allergies  ROS As per HPI  PE: Blood pressure 105/72, pulse 88, temperature 98.6 F (37 C), temperature source Oral, resp. rate 16, height 5' 7.5" (1.715 m), weight 194 lb 8 oz (88.225 kg), last menstrual period 01/22/2016, SpO2 95 %. Gen: Alert, well appearing.  Patient is oriented to person, place, time, and situation. ENT: Ears: EACs clear, normal epithelium.  TMs with good light reflex and landmarks bilaterally.  Eyes: no injection, icteris,  swelling, or exudate.  EOMI, PERRLA. Nose: no drainage or turbinate edema/swelling.  No injection or focal lesion.  Mouth: lips without lesion/swelling.  Oral mucosa pink and moist.  Dentition intact and without obvious caries or gingival swelling.  Oropharynx without erythema, exudate, or swelling.  Neck - No masses or thyromegaly or limitation in range of motion.  A few small nontender LN's are palpable in anterior cerv region CV: RRR, no m/r/g.   LUNGS: CTA bilat, nonlabored resps, good aeration in all lung fields.   LABS:  Rapid strep: NEG  IMPRESSION AND PLAN:  Acute pharyngitis, suspect viral etiology. Rapid strep neg.  Will send group A strep clx. Symptomatic care emphasized.  An After Visit Summary was printed and given to the patient.  FOLLOW UP: Return if symptoms worsen or fail to improve.

## 2016-02-14 NOTE — Progress Notes (Signed)
Pre visit review using our clinic review tool, if applicable. No additional management support is needed unless otherwise documented below in the visit note. 

## 2016-02-16 LAB — CULTURE, GROUP A STREP: ORGANISM ID, BACTERIA: NORMAL

## 2016-02-19 ENCOUNTER — Telehealth: Payer: Self-pay

## 2016-02-19 NOTE — Telephone Encounter (Signed)
-----   Message from Jeoffrey MassedPhilip H McGowen, MD sent at 02/18/2016  4:26 PM EDT ----- Pls notify pt that her throat culture was negative for any STREP bacteria. -thx

## 2016-02-19 NOTE — Telephone Encounter (Signed)
Called patient. No answer. Left detailed message on cell# per DPR.

## 2016-02-25 ENCOUNTER — Encounter: Payer: Self-pay | Admitting: Family Medicine

## 2016-02-25 ENCOUNTER — Encounter: Payer: Self-pay | Admitting: Internal Medicine

## 2016-02-25 ENCOUNTER — Ambulatory Visit (INDEPENDENT_AMBULATORY_CARE_PROVIDER_SITE_OTHER): Payer: BLUE CROSS/BLUE SHIELD | Admitting: Family Medicine

## 2016-02-25 VITALS — BP 119/86 | HR 85 | Temp 98.0°F | Resp 20 | Wt 197.0 lb

## 2016-02-25 DIAGNOSIS — D649 Anemia, unspecified: Secondary | ICD-10-CM | POA: Diagnosis not present

## 2016-02-25 DIAGNOSIS — Z800 Family history of malignant neoplasm of digestive organs: Secondary | ICD-10-CM | POA: Diagnosis not present

## 2016-02-25 DIAGNOSIS — R7309 Other abnormal glucose: Secondary | ICD-10-CM | POA: Insufficient documentation

## 2016-02-25 DIAGNOSIS — R635 Abnormal weight gain: Secondary | ICD-10-CM

## 2016-02-25 DIAGNOSIS — Z6830 Body mass index (BMI) 30.0-30.9, adult: Secondary | ICD-10-CM

## 2016-02-25 DIAGNOSIS — F902 Attention-deficit hyperactivity disorder, combined type: Secondary | ICD-10-CM

## 2016-02-25 LAB — CBC WITH DIFFERENTIAL/PLATELET
BASOS PCT: 0.3 % (ref 0.0–3.0)
Basophils Absolute: 0 10*3/uL (ref 0.0–0.1)
EOS ABS: 0.3 10*3/uL (ref 0.0–0.7)
EOS PCT: 4.5 % (ref 0.0–5.0)
HCT: 41.7 % (ref 36.0–46.0)
HEMOGLOBIN: 13.8 g/dL (ref 12.0–15.0)
LYMPHS PCT: 34 % (ref 12.0–46.0)
Lymphs Abs: 2.2 10*3/uL (ref 0.7–4.0)
MCHC: 33.1 g/dL (ref 30.0–36.0)
MCV: 87.1 fl (ref 78.0–100.0)
MONOS PCT: 6.3 % (ref 3.0–12.0)
Monocytes Absolute: 0.4 10*3/uL (ref 0.1–1.0)
NEUTROS ABS: 3.6 10*3/uL (ref 1.4–7.7)
Neutrophils Relative %: 54.9 % (ref 43.0–77.0)
PLATELETS: 270 10*3/uL (ref 150.0–400.0)
RBC: 4.79 Mil/uL (ref 3.87–5.11)
RDW: 16.7 % — AB (ref 11.5–15.5)
WBC: 6.5 10*3/uL (ref 4.0–10.5)

## 2016-02-25 LAB — FERRITIN: Ferritin: 18.5 ng/mL (ref 10.0–291.0)

## 2016-02-25 LAB — IRON AND TIBC
%SAT: 22 % (ref 11–50)
Iron: 71 ug/dL (ref 40–190)
TIBC: 316 ug/dL (ref 250–450)
UIBC: 245 ug/dL (ref 125–400)

## 2016-02-25 LAB — POCT GLYCOSYLATED HEMOGLOBIN (HGB A1C): Hemoglobin A1C: 5.3

## 2016-02-25 MED ORDER — AMPHETAMINE-DEXTROAMPHETAMINE 30 MG PO TABS: 30.0000 mg | ORAL_TABLET | Freq: Two times a day (BID) | ORAL | Status: DC

## 2016-02-25 NOTE — Progress Notes (Signed)
Patient ID: Jenny Little, female   DOB: Jun 06, 1976, 40 y.o.   MRN: [id number]   Subjective:    Patient ID: Jenny Little, female    DOB: [birth year], 40 y.o.   MRN: [id number]  HPI  Anemia, microcytic: Patient presents with what is presumed to be new onset microcytic anemia in December 2016. Prior records do not show history of anemia. Patient had been more fatigued upon presentation. She does endorse having irregular menstrual cycles, with an occasional heavy menses. Her menstrual cycle is occurring approximately every other month. She has a family history of colon cancer in her maternal grandmother (age 24s). She has been supplementing her diet with iron 3 times a day, and states she does feel a mild increase in energy. She has been working out 3-4 times a week. She states she still is seeing a weight gain which is upsetting to her. She has been trying to watch her diet more closely, and states she thinks she just needs to decrease her carbohydrates as well. Patient denies any bowel changes in color or caliber, melena or hematochezia. Patient was found to have B-12 378. hgb 10.5--> 10.6 in December 2016, hematocrit 33.4 MCV 76.4. She is supplementing B 12 and vitamin D. Her thyroid studies were normal. Hemoccult cards negativein January 2017.  Prediabetes/weight gain/BMI > 30: patient's lasta1c 6.1 in December 2016. Patient had been on metformin in the past from her prior PCP for metabolic syndrome. She states she never took this medication. She has increased her exercise routine and is exercising 3-4 times a week. She is upset today because the scale states that she's gained 3 pounds. She has been watching her diet more closely, attempting to avoid high-fat foods. She thinks she is going to have to try to cut back her carbs as well.  ADHD:Patient states that she was started on ADHD medications at age 34. She reports taking Adderall 30 mg twice a day. She states she was diagnosed with ADHD in eighth  grade, but her mother never wanted her to be medicated. She initially was tried on Vyvanse, but secondary to insurance coverage this was discontinued and she was started on Adderall. She states she has no side effects to the Adderall, and she feels she benefits a great deal with the use of Adderall. She recently attempted to stop using the Adderall because she sometimes wonders if she truly needs the medication. She states she was completely unfocused, and was not able to get work done. She states she noticed she was starting projects without finishing them around home. So she decided to restart the medication.  Past Medical History  Diagnosis Date  . Allergy   . Anxiety   . ADHD (attention deficit hyperactivity disorder)   . Migraine   . Fracture, toe   . Depression    No Known Allergies  Social History   Social History  . Marital Status: Married    Spouse Name: N/A  . Number of Children: N/A  . Years of Education: N/A   Occupational History  . Not on file.   Social History Main Topics  . Smoking status: Never Smoker   . Smokeless tobacco: Never Used  . Alcohol Use: No  . Drug Use: No  . Sexual Activity: Yes     Comment: female    Other Topics Concern  . Not on file   Social History Narrative   Married. Husband's name Onalee Hua. 2 children, Jenny Little and Jenny Little.   Bachelor's in social work.  Employed full-time as a Customer service managerreal estate agent.   [place].   Takes a daily vitamin, wears her seatbelt, wears a bike helmet, exercises at least 3 times a week.   Smoke detector located in the home.   No firearms in the home.   Patient feels safe in her relationship.    Review of Systems Negative, with the exception of above mentioned in HPI\    Objective:   Physical Exam BP 119/86 mmHg  Pulse 85  Temp(Src) 98 F (36.7 C)  Resp 20  Wt 197 lb (89.359 kg)  SpO2 96%  LMP 02/18/2016 Gen: Afebrile. No acute distress. Nontoxic in appearance, well-developed,  well-nourished, Caucasian female. Very pleasant. HENT: AT. [place].  MMM.  Eyes:Pupils Equal Round Reactive to light, Extraocular movements intact,  Conjunctiva without redness, discharge or icterus. CV: RRR, no edema Chest: CTAB, no wheeze or crackles Skin: No rashes, purpura or petechiae.  Neuro: Normal gait. PERLA. EOMi. Alert. Oriented x3    Assessment & Plan:  Jenny Little is a 40 y.o. female present for : Elevated hemoglobin A1c - POCT HgB A1C--> 5.3, much improved today. Congratulated patient that her exercise regimen is paying off she has decreased her hemoglobin A1c from 6.1 to 5.3 today.    FH: colon cancer/Iron deficiency anemia - History of new onset iron deficiency anemia, with a family history of colon cancer in her maternal grandmother in her 6440s. Patient should be screened for colonoscopy to rule out cause of iron deficiency anemia. - continue iron supplementation 3 times a day; if iron stores are not her on repeat labs today, will consider hematology referral for IV iron supplementation. - Iron Binding Cap (TIBC) - Ferritin - CBC w/Diff - Discussed with patient, she should follow-up with her gynecologist as well. Her menstrual cycles are irregular and can be "heavy" at times. Discussed with her causes of iron deficiency anemia, including blood loss. This could be secondary to GI loss or gynecological loss. - Ambulatory referral to Gastroenterology  Attention deficit hyperactivity disorder (ADHD), combined type - Refills will be due in July. - amphetamine-dextroamphetamine (ADDERALL) 30 MG tablet; Take 1 tablet by mouth 2 (two) times daily.  Dispense: 180 tablet; Refill: 0   BMI 30.0-30.9,adult/weight gain - Amb ref to Medical Nutrition Therapy-MNT - Discussed diet full of fresh vegetables, fruits and lean meats. Patient to continue exercising greater than 150 minutes a week.  Follow-up 6 months, sooner if needed.  Electronically Signed by: Felix Pacinienee Kuneff, DO [place]- OR

## 2016-02-25 NOTE — Patient Instructions (Addendum)
I will place a nutrition referral for you today.  Your a1c looks amazing, the exercise is helping! 5.3 today.  Once I get the results of your blood work today we will all you, if needed will placed referral to blood specialist. I have also placed a referral for a screening colonoscopy considering family history and anemia.  I would talk to your gynecologist about your periods.   F/U 6 months unless needed sooner.

## 2016-02-26 ENCOUNTER — Telehealth: Payer: Self-pay | Admitting: Family Medicine

## 2016-02-26 NOTE — Telephone Encounter (Signed)
Please call pt: - all of her labs are normal now! Continue iron supplementation. Would still recommend she follow up with GI (she should have early colon cancer screening given FH) and her GYN (irregular menses).

## 2016-02-26 NOTE — Telephone Encounter (Signed)
Spoke with patient reviewed labs and instructions. patient verbalized understanding.

## 2016-03-15 LAB — HM MAMMOGRAPHY: HM Mammogram: NORMAL (ref 0–4)

## 2016-04-18 ENCOUNTER — Ambulatory Visit: Payer: BLUE CROSS/BLUE SHIELD | Admitting: Internal Medicine

## 2016-05-05 ENCOUNTER — Telehealth: Payer: Self-pay | Admitting: Family Medicine

## 2016-05-05 NOTE — Telephone Encounter (Signed)
Very nice patient. Sorry to see her go, but should not be an issues with her transferring.

## 2016-05-05 NOTE — Telephone Encounter (Signed)
Pt asking to trans care to Dr Beverely Lowabori. Please advise ok to schedule.

## 2016-05-06 NOTE — Telephone Encounter (Signed)
Pt has been schedule.

## 2016-05-15 ENCOUNTER — Ambulatory Visit (INDEPENDENT_AMBULATORY_CARE_PROVIDER_SITE_OTHER): Payer: BLUE CROSS/BLUE SHIELD | Admitting: Family Medicine

## 2016-05-15 ENCOUNTER — Encounter: Payer: Self-pay | Admitting: Family Medicine

## 2016-05-15 VITALS — BP 110/82 | HR 90 | Temp 97.9°F | Resp 16 | Ht 68.0 in | Wt 201.2 lb

## 2016-05-15 DIAGNOSIS — Z6830 Body mass index (BMI) 30.0-30.9, adult: Secondary | ICD-10-CM | POA: Diagnosis not present

## 2016-05-15 DIAGNOSIS — N926 Irregular menstruation, unspecified: Secondary | ICD-10-CM | POA: Diagnosis not present

## 2016-05-15 DIAGNOSIS — F902 Attention-deficit hyperactivity disorder, combined type: Secondary | ICD-10-CM

## 2016-05-15 DIAGNOSIS — F411 Generalized anxiety disorder: Secondary | ICD-10-CM

## 2016-05-15 NOTE — Progress Notes (Signed)
Pre visit review using our clinic review tool, if applicable. No additional management support is needed unless otherwise documented below in the visit note. 

## 2016-05-15 NOTE — Progress Notes (Signed)
   Subjective:    Patient ID: Jenny Little, female    DOB: 09/08/1976, 40 y.o.   MRN: [id number]  HPI New to establish.  Previous MD- Claiborne BillingsKuneff  Family hx of colon cancer- MGM, passed at age 42.  Had colonoscopy scheduled but was going out of town.  Has upcoming colonoscopy 7/24 w/ Dr Leone PayorGessner.  Obesity- exercising regularly, lifting weight, playing tennis.  Pt admits that she has difficulty eating well but she is trying to do better.  Pt went on Weight Watchers in 7th grade.  No CP, SOB, HAs, visual changes, edema.  ADHD- pt has been off stimulant medication x2 weeks b/c she was trying to control w/ behavior modification.  Pt is struggling w/ her focus but she is conflicted between being on the medication for symptom control vs the feeling of needing a medication and 'feeling weak'.  Anxiety- chronic problem, on Celexa daily.  Pt feels that her dose is appropriate and is not interested in increasing at this time.  Irregular menses- pt had very long and heavy period last month.  + cramping.  + family hx of early menopause.  Pt is interested in GYN.     Review of Systems For ROS see HPI     Objective:   Physical Exam  Constitutional: She is oriented to person, place, and time. She appears well-developed and well-nourished. No distress.  HENT:  Head: Normocephalic and atraumatic.  Eyes: Conjunctivae and EOM are normal. Pupils are equal, round, and reactive to light.  Neck: Normal range of motion. Neck supple. No thyromegaly present.  Cardiovascular: Normal rate, regular rhythm, normal heart sounds and intact distal pulses.   No murmur heard. Pulmonary/Chest: Effort normal and breath sounds normal. No respiratory distress.  Abdominal: Soft. She exhibits no distension. There is no tenderness.  Musculoskeletal: She exhibits no edema.  Lymphadenopathy:    She has no cervical adenopathy.  Neurological: She is alert and oriented to person, place, and time.  Skin: Skin is warm and dry.    Psychiatric: She has a normal mood and affect. Her behavior is normal.  Vitals reviewed.         Assessment & Plan:

## 2016-05-15 NOTE — Patient Instructions (Signed)
Follow up in 6-8 weeks to recheck ADHD/anxiety Restart the Adderall daily Continue the good work on healthy diet and regular exercise- you can do it! We'll call you with your GYN appt Call with any questions or concerns Welcome!  We're glad to have you!!!

## 2016-05-16 ENCOUNTER — Encounter: Payer: Self-pay | Admitting: Family Medicine

## 2016-05-24 NOTE — Assessment & Plan Note (Signed)
Chronic problem.  Pt is trying to lose weight w/ increased exercise and is considering going back to Weight Watchers.  Encouraged her efforts.  Will continue to follow.

## 2016-05-24 NOTE — Assessment & Plan Note (Signed)
Chronic problem.  Pt is asking for referral to gyn- order entered

## 2016-05-24 NOTE — Assessment & Plan Note (Signed)
Chronic problem.  Feels her current sxs are well controlled on Celexa.  Not interested in changing meds or adjusting dose.  Will follow.

## 2016-05-24 NOTE — Assessment & Plan Note (Signed)
Chronic problem.  Deteriorated recently since she stopped her medication.  Pt plans to restart in July when she is able to pick up her new prescription.  Will fill medication as needed.  Will follow.

## 2016-06-16 ENCOUNTER — Ambulatory Visit (INDEPENDENT_AMBULATORY_CARE_PROVIDER_SITE_OTHER): Payer: BLUE CROSS/BLUE SHIELD | Admitting: Internal Medicine

## 2016-06-16 ENCOUNTER — Encounter: Payer: Self-pay | Admitting: Internal Medicine

## 2016-06-16 VITALS — BP 112/76 | HR 84 | Ht 67.5 in | Wt 201.5 lb

## 2016-06-16 DIAGNOSIS — D509 Iron deficiency anemia, unspecified: Secondary | ICD-10-CM | POA: Diagnosis not present

## 2016-06-16 DIAGNOSIS — Z8371 Family history of colonic polyps: Secondary | ICD-10-CM | POA: Diagnosis not present

## 2016-06-16 DIAGNOSIS — Z800 Family history of malignant neoplasm of digestive organs: Secondary | ICD-10-CM

## 2016-06-16 NOTE — Patient Instructions (Signed)

## 2016-06-16 NOTE — Progress Notes (Signed)
  Referred by: Natalia Leatherwood, DO [address] [place], [place] [address]   Subjective:    Patient ID: Jenny Little, female    DOB: Dec 06, 1975, 40 y.o.   MRN: [id number] Cc: family history of colon cancer, anemia HPI Very nice married 40 yo ww with fHx of a maternal grandmother dying at 1 from colon cancer - metastatic. Had a paternal grandmother die of a metastatic cancer at 42. ? Colon Mother has hx of multiple colonoscopy and polypectomies. Patient also dx w/ iron-deficiency anemia in past year - has menorrhagia and is getting GYN evaluation. Has had negative stool hemoccults and only occasional premenstrual bloating. No other GI sxs, no bleeding. She took iron supplements and ferritin and HGb normalized so stopped but is feeling tired again. Medications, allergies, past medical history, past surgical history, family history and social history are reviewed and updated in the EMR.  Review of Systems As above - has gained weight in past yr Wt Readings from Last 3 Encounters:  06/16/16 201 lb 8 oz (91.4 kg)  05/15/16 201 lb 4 oz (91.3 kg)  02/25/16 197 lb (89.4 kg)   All other ROS neg    Objective:   Physical Exam @BP  112/76 (BP Location: Left Arm, Patient Position: Sitting, Cuff Size: Normal)   Pulse 84   Ht 5' 7.5" (1.715 m) Comment: height measured without shoes  Wt 201 lb 8 oz (91.4 kg)   BMI 31.09 kg/m @  General:  Well-developed, well-nourished and in no acute distress Eyes:  anicteric. ENT:   Mouth and posterior pharynx free of lesions.  Neck:   supple w/o thyromegaly or mass.  Lungs: Clear to auscultation bilaterally. Heart:  S1S2, no rubs, murmurs, gallops. Abdomen:  soft, non-tender, no hepatosplenomegaly, hernia, or mass and BS+.  Rectal: deferred Lymph:  no cervical or supraclavicular adenopathy. Extremities:   no edema, cyanosis or clubbing Skin   no rash. Neuro:  A&O x 3.  Psych:  appropriate mood and  Affect.   Data Reviewed: As per HPI Lab Results    Component Value Date   FERRITIN 18.5 02/25/2016  was 4/1 10/2015 CBC Latest Ref Rng & Units 02/25/2016 11/13/2015 10/26/2015  WBC 4.0 - 10.5 K/uL 6.5 6.7 6.2  Hemoglobin 12.0 - 15.0 g/dL 89.3 10.6(L) 10.5(L)  Hematocrit 36.0 - 46.0 % 41.7 34.0(L) 33.5(L)  Platelets 150.0 - 400.0 K/uL 270.0 354.0 336.0      Assessment & Plan:   Encounter Diagnoses  Name Primary?  . Family history of colon cancer Yes  . Anemia, iron deficiency   . Family history of colonic polyps     I think iron-deficiency anemia is due to menstrual blood loss almost certainly.  Family hx raises concerns for familial cancer syndrome given young age of grandmother dying from colon cancer plus mom's hx numerous polyps. I have recommended a screening colonoscopy. If no polyps then likely wait 10 yrs for next. The risks and benefits as well as alternatives of endoscopic procedure(s) have been discussed and reviewed. All questions answered. The patient agrees to proceed.  I appreciate the opportunity to care for this patient. TD:[id number] Beverely Low, MD Felix Pacini, DO [facility]

## 2016-06-25 ENCOUNTER — Ambulatory Visit: Payer: BLUE CROSS/BLUE SHIELD | Admitting: Family Medicine

## 2016-06-26 ENCOUNTER — Ambulatory Visit (INDEPENDENT_AMBULATORY_CARE_PROVIDER_SITE_OTHER): Payer: BLUE CROSS/BLUE SHIELD | Admitting: Family Medicine

## 2016-06-26 ENCOUNTER — Encounter: Payer: Self-pay | Admitting: Family Medicine

## 2016-06-26 VITALS — BP 118/80 | HR 86 | Temp 98.0°F | Resp 16 | Ht 68.0 in | Wt 201.5 lb

## 2016-06-26 DIAGNOSIS — F902 Attention-deficit hyperactivity disorder, combined type: Secondary | ICD-10-CM | POA: Diagnosis not present

## 2016-06-26 DIAGNOSIS — Z6830 Body mass index (BMI) 30.0-30.9, adult: Secondary | ICD-10-CM | POA: Diagnosis not present

## 2016-06-26 LAB — HEPATIC FUNCTION PANEL
ALBUMIN: 4.3 g/dL (ref 3.5–5.2)
ALK PHOS: 49 U/L (ref 39–117)
ALT: 14 U/L (ref 0–35)
AST: 15 U/L (ref 0–37)
Bilirubin, Direct: 0.1 mg/dL (ref 0.0–0.3)
TOTAL PROTEIN: 6.9 g/dL (ref 6.0–8.3)
Total Bilirubin: 0.7 mg/dL (ref 0.2–1.2)

## 2016-06-26 LAB — BASIC METABOLIC PANEL WITH GFR
BUN: 15 mg/dL (ref 6–23)
CO2: 29 meq/L (ref 19–32)
Calcium: 9.1 mg/dL (ref 8.4–10.5)
Chloride: 102 meq/L (ref 96–112)
Creatinine, Ser: 0.63 mg/dL (ref 0.40–1.20)
GFR: 110.23 mL/min
Glucose, Bld: 94 mg/dL (ref 70–99)
Potassium: 4.3 meq/L (ref 3.5–5.1)
Sodium: 137 meq/L (ref 135–145)

## 2016-06-26 LAB — CBC WITH DIFFERENTIAL/PLATELET
BASOS ABS: 0 10*3/uL (ref 0.0–0.1)
Basophils Relative: 0.4 % (ref 0.0–3.0)
EOS ABS: 0.2 10*3/uL (ref 0.0–0.7)
Eosinophils Relative: 2.6 % (ref 0.0–5.0)
HEMATOCRIT: 41.5 % (ref 36.0–46.0)
Hemoglobin: 14.1 g/dL (ref 12.0–15.0)
LYMPHS ABS: 2.4 10*3/uL (ref 0.7–4.0)
LYMPHS PCT: 32.3 % (ref 12.0–46.0)
MCHC: 33.8 g/dL (ref 30.0–36.0)
MCV: 90.8 fl (ref 78.0–100.0)
MONO ABS: 0.5 10*3/uL (ref 0.1–1.0)
Monocytes Relative: 6.8 % (ref 3.0–12.0)
NEUTROS ABS: 4.2 10*3/uL (ref 1.4–7.7)
NEUTROS PCT: 57.9 % (ref 43.0–77.0)
PLATELETS: 265 10*3/uL (ref 150.0–400.0)
RBC: 4.57 Mil/uL (ref 3.87–5.11)
RDW: 12.9 % (ref 11.5–15.5)
WBC: 7.3 10*3/uL (ref 4.0–10.5)

## 2016-06-26 LAB — LIPID PANEL
CHOLESTEROL: 239 mg/dL — AB (ref 0–200)
HDL: 53 mg/dL (ref >39.00)
LDL Cholesterol: 155 mg/dL — ABNORMAL HIGH (ref 0–99)
NonHDL: 186.43
Total CHOL/HDL Ratio: 5
Triglycerides: 156 mg/dL — ABNORMAL HIGH (ref 0.0–149.0)
VLDL: 31.2 mg/dL (ref 0.0–40.0)

## 2016-06-26 LAB — TSH: TSH: 1.61 u[IU]/mL (ref 0.35–4.50)

## 2016-06-26 NOTE — Patient Instructions (Signed)
Schedule your complete physical in 6 months We'll notify you of your lab results and make any changes if needed Continue to work on healthy diet and regular exercise- you look good! Call with any questions or concerns Enjoy the rest of your summer!!!

## 2016-06-26 NOTE — Progress Notes (Signed)
Pre visit review using our clinic review tool, if applicable. No additional management support is needed unless otherwise documented below in the visit note. 

## 2016-06-26 NOTE — Assessment & Plan Note (Signed)
Ongoing issue for pt.  She just started a new supervised diet plan.  Plan reviewed- seems very worthwhile.  Check labs to risk stratify.  Will follow.

## 2016-06-26 NOTE — Assessment & Plan Note (Signed)
Chronic problem.  Pt reports doing well since restarting Adderall.  No side effects at this time.  Pt to continue med daily- no changes at this time

## 2016-06-26 NOTE — Progress Notes (Signed)
   Subjective:    Patient ID: Jenny Little, female    DOB: [birth year], 40 y.o.   MRN: [id number]  HPI ADHD- pt restarted Adderall at last visit.  BP remains well controlled, HR WNL.  sxs are currently well controlled.  Feel restarting medication improved anxiety.  No palpitations, sleeping fairly well.  Denies CP, SOB.    Obesity- chronic problem.  Pt is working on new diet plan- Jenny Little which is a low carb, frequent meal plan.  Pt would like labs checked.   Review of Systems For ROS see HPI     Objective:   Physical Exam  Constitutional: She is oriented to person, place, and time. She appears well-developed and well-nourished. No distress.  HENT:  Head: Normocephalic and atraumatic.  Eyes: Conjunctivae and EOM are normal. Pupils are equal, round, and reactive to light.  Neck: Normal range of motion. Neck supple. No thyromegaly present.  Cardiovascular: Normal rate, regular rhythm, normal heart sounds and intact distal pulses.   No murmur heard. Pulmonary/Chest: Effort normal and breath sounds normal. No respiratory distress.  Abdominal: Soft. She exhibits no distension. There is no tenderness.  Musculoskeletal: She exhibits no edema.  Lymphadenopathy:    She has no cervical adenopathy.  Neurological: She is alert and oriented to person, place, and time.  Skin: Skin is warm and dry.  Psychiatric: She has a normal mood and affect. Her behavior is normal.  Vitals reviewed.         Assessment & Plan:

## 2016-06-27 ENCOUNTER — Other Ambulatory Visit: Payer: Self-pay | Admitting: General Practice

## 2016-06-27 DIAGNOSIS — E785 Hyperlipidemia, unspecified: Secondary | ICD-10-CM

## 2016-06-27 MED ORDER — ATORVASTATIN CALCIUM 10 MG PO TABS: 10.0000 mg | ORAL_TABLET | Freq: Every day | ORAL | 6 refills | Status: DC

## 2016-07-09 ENCOUNTER — Telehealth: Payer: Self-pay | Admitting: Family Medicine

## 2016-07-09 ENCOUNTER — Encounter: Payer: Self-pay | Admitting: Internal Medicine

## 2016-07-09 NOTE — Telephone Encounter (Signed)
Noted pt. informed

## 2016-07-09 NOTE — Telephone Encounter (Signed)
Ok to try diet and exercise first

## 2016-07-09 NOTE — Telephone Encounter (Signed)
Spoke with pt and advised of her results. Pt wants to hold off on the cholesterol medication until her CPE in February. She states she has started a new diet and is already down 7lbs. I advised pt I would check with PCP but did not think that would be a problem.

## 2016-07-09 NOTE — Telephone Encounter (Signed)
Pt calling for lab results, states she is not able to get into her MyChart acct. I did give her the number to reset it, but still would like a call back.

## 2016-07-10 ENCOUNTER — Telehealth: Payer: Self-pay | Admitting: Emergency Medicine

## 2016-07-10 MED ORDER — CITALOPRAM HYDROBROMIDE 20 MG PO TABS: 20.0000 mg | ORAL_TABLET | Freq: Every day | ORAL | 1 refills | Status: DC

## 2016-07-10 NOTE — Telephone Encounter (Signed)
Patient called needing a refill on her Citalopram. Patient [facility]. Patient is out of medication and would like a refill.  Last OV 06/26/16 ADHD Last rx 10/25/15 Patient would like rx to [facility] to [facility] [address] Please notify patient when medication has been sent

## 2016-07-10 NOTE — Telephone Encounter (Signed)
Pt informed, Rx filled to pharmacy.

## 2016-07-22 ENCOUNTER — Encounter: Payer: BLUE CROSS/BLUE SHIELD | Admitting: Internal Medicine

## 2016-07-31 ENCOUNTER — Other Ambulatory Visit: Payer: Self-pay | Admitting: Family Medicine

## 2016-07-31 DIAGNOSIS — F902 Attention-deficit hyperactivity disorder, combined type: Secondary | ICD-10-CM

## 2016-07-31 MED ORDER — AMPHETAMINE-DEXTROAMPHETAMINE 30 MG PO TABS: 30.0000 mg | ORAL_TABLET | Freq: Two times a day (BID) | ORAL | 0 refills | Status: DC

## 2016-07-31 NOTE — Telephone Encounter (Signed)
Pt needs refill on adderall °

## 2016-07-31 NOTE — Telephone Encounter (Signed)
Pt called and informed rx available at front desk for pick up.

## 2016-07-31 NOTE — Telephone Encounter (Signed)
Last OV 06/26/16 adderall last filled 02/25/16 #180 with 0

## 2016-09-03 ENCOUNTER — Ambulatory Visit: Payer: BLUE CROSS/BLUE SHIELD | Admitting: Family Medicine

## 2016-09-05 ENCOUNTER — Encounter: Payer: Self-pay | Admitting: Family Medicine

## 2016-09-05 ENCOUNTER — Ambulatory Visit (INDEPENDENT_AMBULATORY_CARE_PROVIDER_SITE_OTHER): Payer: BLUE CROSS/BLUE SHIELD | Admitting: Family Medicine

## 2016-09-05 VITALS — BP 121/82 | HR 90 | Temp 98.5°F | Resp 16 | Ht 68.0 in | Wt 193.5 lb

## 2016-09-05 DIAGNOSIS — J0100 Acute maxillary sinusitis, unspecified: Secondary | ICD-10-CM | POA: Diagnosis not present

## 2016-09-05 MED ORDER — AMOXICILLIN 875 MG PO TABS: 875.0000 mg | ORAL_TABLET | Freq: Two times a day (BID) | ORAL | 0 refills | Status: DC

## 2016-09-05 NOTE — Patient Instructions (Signed)
Follow up as needed Start the Amoxicillin twice daily- take w/ food Drink plenty of fluids REST as able! Call with any questions or concerns Try and have fun this weekend!!!

## 2016-09-05 NOTE — Progress Notes (Signed)
Pre visit review using our clinic review tool, if applicable. No additional management support is needed unless otherwise documented below in the visit note. 

## 2016-09-05 NOTE — Assessment & Plan Note (Signed)
Pt's sxs and PE consistent w/ infxn.  Start abx.  Reviewed supportive care and red flags that should prompt return.  Pt expressed understanding and is in agreement w/ plan.  

## 2016-09-05 NOTE — Progress Notes (Signed)
   Subjective:    Patient ID: Jenny Little, female    DOB: 08/11/1976, 40 y.o.   MRN: [id number]  HPI URI- sxs started earlier this week.  No fevers.  + fatigue, facial pressure, nasal congestion, HA.  No N/V.  Bilateral ear pressure.  + tooth pain.  No known sick contacts but pt has hx of sinus infxns and this feels similar.  Minimal cough.   Review of Systems For ROS see HPI     Objective:   Physical Exam  Constitutional: She appears well-developed and well-nourished. No distress.  HENT:  Head: Normocephalic and atraumatic.  Right Ear: Tympanic membrane normal.  Left Ear: Tympanic membrane normal.  Nose: Mucosal edema and rhinorrhea present. Right sinus exhibits maxillary sinus tenderness. Right sinus exhibits no frontal sinus tenderness. Left sinus exhibits maxillary sinus tenderness. Left sinus exhibits no frontal sinus tenderness.  Mouth/Throat: Uvula is midline and mucous membranes are normal. Posterior oropharyngeal erythema present. No oropharyngeal exudate.  Eyes: Conjunctivae and EOM are normal. Pupils are equal, round, and reactive to light.  Neck: Normal range of motion. Neck supple.  Cardiovascular: Normal rate, regular rhythm and normal heart sounds.   Pulmonary/Chest: Effort normal and breath sounds normal. No respiratory distress. She has no wheezes.  Lymphadenopathy:    She has no cervical adenopathy.  Vitals reviewed.         Assessment & Plan:

## 2016-10-10 ENCOUNTER — Telehealth: Payer: Self-pay | Admitting: *Deleted

## 2016-10-10 DIAGNOSIS — F902 Attention-deficit hyperactivity disorder, combined type: Secondary | ICD-10-CM

## 2016-10-10 MED ORDER — AMPHETAMINE-DEXTROAMPHETAMINE 30 MG PO TABS: 30.0000 mg | ORAL_TABLET | Freq: Two times a day (BID) | ORAL | 0 refills | Status: DC

## 2016-10-10 NOTE — Telephone Encounter (Signed)
Patient calling to see if she can come by to pick up a new prescription for her adderall.   She is requesting a call back when it is ready so that she can come by and pick it up.

## 2016-10-10 NOTE — Telephone Encounter (Signed)
Ok for 3 month refill (#270)

## 2016-10-10 NOTE — Telephone Encounter (Signed)
Last OV 09/05/16 (sinus) adderall last filled 07/31/16 #180 with 0

## 2016-10-10 NOTE — Telephone Encounter (Signed)
Made aware and rx placed at our front desk for pick up.

## 2016-12-09 ENCOUNTER — Other Ambulatory Visit: Payer: Self-pay | Admitting: Family Medicine

## 2016-12-09 DIAGNOSIS — F902 Attention-deficit hyperactivity disorder, combined type: Secondary | ICD-10-CM

## 2016-12-09 NOTE — Telephone Encounter (Signed)
Pt requesting refill of amphetamine-dextroamphetamine (ADDERALL) 30 MG tablet °

## 2016-12-09 NOTE — Telephone Encounter (Signed)
Last oV 09/05/16 (sinus) adderall last filled 10/10/16 #180 with 0

## 2017-01-04 ENCOUNTER — Other Ambulatory Visit: Payer: Self-pay | Admitting: Family Medicine

## 2017-01-06 ENCOUNTER — Other Ambulatory Visit: Payer: Self-pay | Admitting: Family Medicine

## 2017-01-06 DIAGNOSIS — F902 Attention-deficit hyperactivity disorder, combined type: Secondary | ICD-10-CM

## 2017-01-06 NOTE — Telephone Encounter (Signed)
**  Remind patient they can make refill requests via MyChart**  Medication refill request (Name & Dosage): adderall 30 mg 2x a day   Preferred pharmacy (Name & Address): [facility] [address] [facility] [address]     Other comments (if applicable):

## 2017-01-07 ENCOUNTER — Telehealth: Payer: Self-pay | Admitting: Family Medicine

## 2017-01-07 MED ORDER — OSELTAMIVIR PHOSPHATE 75 MG PO CAPS: 75.0000 mg | ORAL_CAPSULE | Freq: Two times a day (BID) | ORAL | 0 refills | Status: DC

## 2017-01-07 MED ORDER — AMPHETAMINE-DEXTROAMPHETAMINE 30 MG PO TABS: 30.0000 mg | ORAL_TABLET | Freq: Two times a day (BID) | ORAL | 0 refills | Status: DC

## 2017-01-07 NOTE — Telephone Encounter (Signed)
Last OV 09/05/16 (sinus) adderall last filled 10/10/16 #180 with 0

## 2017-01-07 NOTE — Telephone Encounter (Signed)
Medication filled to pharmacy as requested.   

## 2017-01-07 NOTE — Telephone Encounter (Signed)
Pt informed, med available at front desk for pick up.  

## 2017-01-07 NOTE — Telephone Encounter (Signed)
Patient missed a call from RoxieJessica B.  Thank you,  -LL

## 2017-01-07 NOTE — Telephone Encounter (Signed)
Patient called requesting a prescription for Tamaflu.  Thank you,  -LL

## 2017-01-07 NOTE — Telephone Encounter (Signed)
Ok for Tamiflu 75mg , BID x5 days, #10, no refills

## 2017-01-07 NOTE — Telephone Encounter (Signed)
Called pt to find out if she is having flu symptoms (needs an appointment ) or has a family member been diagnosed with the flu?   Cannot give prescription without this information.

## 2017-01-07 NOTE — Telephone Encounter (Signed)
At son's field trip, says she feels like crap. Went to walk-in clinic last week and she was given amoxicillin for 7 days. She just finished the medication.  Symptoms: skin hurts, head feels full, chills. Client was in the car with her last week was diagnosed with the flu.    [facility] [address]

## 2017-01-09 ENCOUNTER — Encounter: Payer: Self-pay | Admitting: Family Medicine

## 2017-01-09 ENCOUNTER — Ambulatory Visit (INDEPENDENT_AMBULATORY_CARE_PROVIDER_SITE_OTHER): Payer: BLUE CROSS/BLUE SHIELD | Admitting: Family Medicine

## 2017-01-09 VITALS — BP 126/86 | HR 75 | Temp 99.0°F | Resp 16 | Ht 68.0 in | Wt 201.0 lb

## 2017-01-09 DIAGNOSIS — Z0000 Encounter for general adult medical examination without abnormal findings: Secondary | ICD-10-CM

## 2017-01-09 LAB — CBC WITH DIFFERENTIAL/PLATELET
Basophils Absolute: 0.1 10*3/uL (ref 0.0–0.1)
Basophils Relative: 0.8 % (ref 0.0–3.0)
EOS ABS: 0.2 10*3/uL (ref 0.0–0.7)
Eosinophils Relative: 3 % (ref 0.0–5.0)
HCT: 40.7 % (ref 36.0–46.0)
HEMOGLOBIN: 13.7 g/dL (ref 12.0–15.0)
Lymphocytes Relative: 28 % (ref 12.0–46.0)
Lymphs Abs: 2.2 10*3/uL (ref 0.7–4.0)
MCHC: 33.7 g/dL (ref 30.0–36.0)
MCV: 88.6 fl (ref 78.0–100.0)
MONO ABS: 0.5 10*3/uL (ref 0.1–1.0)
Monocytes Relative: 6 % (ref 3.0–12.0)
Neutro Abs: 4.9 10*3/uL (ref 1.4–7.7)
Neutrophils Relative %: 62.2 % (ref 43.0–77.0)
Platelets: 278 10*3/uL (ref 150.0–400.0)
RBC: 4.59 Mil/uL (ref 3.87–5.11)
RDW: 13.5 % (ref 11.5–15.5)
WBC: 7.9 10*3/uL (ref 4.0–10.5)

## 2017-01-09 LAB — LIPID PANEL
Cholesterol: 258 mg/dL — ABNORMAL HIGH (ref 0–200)
HDL: 49.5 mg/dL (ref >39.00)
LDL Cholesterol: 179 mg/dL — ABNORMAL HIGH (ref 0–99)
NonHDL: 208.16
TRIGLYCERIDES: 145 mg/dL (ref 0.0–149.0)
Total CHOL/HDL Ratio: 5
VLDL: 29 mg/dL (ref 0.0–40.0)

## 2017-01-09 LAB — HEPATIC FUNCTION PANEL
ALBUMIN: 4.3 g/dL (ref 3.5–5.2)
ALT: 15 U/L (ref 0–35)
AST: 17 U/L (ref 0–37)
Alkaline Phosphatase: 48 U/L (ref 39–117)
Bilirubin, Direct: 0.1 mg/dL (ref 0.0–0.3)
Total Bilirubin: 0.5 mg/dL (ref 0.2–1.2)
Total Protein: 6.8 g/dL (ref 6.0–8.3)

## 2017-01-09 LAB — BASIC METABOLIC PANEL
BUN: 13 mg/dL (ref 6–23)
CHLORIDE: 102 meq/L (ref 96–112)
CO2: 28 meq/L (ref 19–32)
CREATININE: 0.74 mg/dL (ref 0.40–1.20)
Calcium: 9.4 mg/dL (ref 8.4–10.5)
GFR: 91.31 mL/min (ref >60.00)
Glucose, Bld: 84 mg/dL (ref 70–99)
Potassium: 4.9 mEq/L (ref 3.5–5.1)
Sodium: 138 mEq/L (ref 135–145)

## 2017-01-09 LAB — TSH: TSH: 1.67 u[IU]/mL (ref 0.35–4.50)

## 2017-01-09 LAB — VITAMIN D 25 HYDROXY (VIT D DEFICIENCY, FRACTURES): VITD: 29.56 ng/mL — ABNORMAL LOW (ref 30.00–100.00)

## 2017-01-09 MED ORDER — CITALOPRAM HYDROBROMIDE 10 MG PO TABS: 10.0000 mg | ORAL_TABLET | Freq: Every day | ORAL | 3 refills | Status: DC

## 2017-01-09 NOTE — Patient Instructions (Signed)
Follow up in 6 months to recheck cholesterol We'll notify you of your lab results and make any changes if needed Continue to work on healthy diet and regular exercise- you can do it!!! Decrease the Celexa to 10mg  daily- I sent the prescription to the pharmacy Call and schedule your mammogram Finish the Tamiflu as directed Call with any questions or concerns Hang in there!!!  You will get better!!!

## 2017-01-09 NOTE — Assessment & Plan Note (Signed)
Pt's PE WNL w/ exception of being overweight.  UTD on GYN.  Due for mammo in April- pt to call.  Check labs.  Anticipatory guidance provided.

## 2017-01-09 NOTE — Progress Notes (Signed)
Pre visit review using our clinic review tool, if applicable. No additional management support is needed unless otherwise documented below in the visit note. 

## 2017-01-09 NOTE — Progress Notes (Signed)
   Subjective:    Patient ID: Jenny Little, female    DOB: 03/14/1976, 41 y.o.   MRN: [id number]  HPI CPE- pt started on Tamiflu on Wednesday.  UTD on GYN.  Due for mammo in April.     Review of Systems Patient reports no vision/ hearing changes, adenopathy,fever, weight change,  persistant/recurrent hoarseness , swallowing issues, chest pain, palpitations, edema, persistant/recurrent cough, hemoptysis, dyspnea (rest/exertional/paroxysmal nocturnal), gastrointestinal bleeding (melena, rectal bleeding), abdominal pain, significant heartburn, bowel changes, GU symptoms (dysuria, hematuria, incontinence), Gyn symptoms (abnormal  bleeding, pain),  syncope, focal weakness, memory loss, numbness & tingling, skin/hair/nail changes, abnormal bruising or bleeding, anxiety, or depression.     Objective:   Physical Exam General Appearance:    Alert, cooperative, no distress, appears stated age  Head:    Normocephalic, without obvious abnormality, atraumatic  Eyes:    PERRL, conjunctiva/corneas clear, EOM's intact, fundi    benign, both eyes  Ears:    Normal TM's and external ear canals, both ears  Nose:   Nares normal, septum midline, mucosa normal, no drainage    or sinus tenderness  Throat:   Lips, mucosa, and tongue normal; teeth and gums normal  Neck:   Supple, symmetrical, trachea midline, no adenopathy;    Thyroid: no enlargement/tenderness/nodules  Back:     Symmetric, no curvature, ROM normal, no CVA tenderness  Lungs:     Clear to auscultation bilaterally, respirations unlabored  Chest Wall:    No tenderness or deformity   Heart:    Regular rate and rhythm, S1 and S2 normal, no murmur, rub   or gallop  Breast Exam:    Deferred to GYN  Abdomen:     Soft, non-tender, bowel sounds active all four quadrants,    no masses, no organomegaly  Genitalia:    Deferred to GYN  Rectal:    Extremities:   Extremities normal, atraumatic, no cyanosis or edema  Pulses:   2+ and symmetric all  extremities  Skin:   Skin color, texture, turgor normal, no rashes or lesions  Lymph nodes:   Cervical, supraclavicular, and axillary nodes normal  Neurologic:   CNII-XII intact, normal strength, sensation and reflexes    throughout          Assessment & Plan:

## 2017-01-12 ENCOUNTER — Other Ambulatory Visit: Payer: Self-pay | Admitting: General Practice

## 2017-01-12 DIAGNOSIS — E785 Hyperlipidemia, unspecified: Secondary | ICD-10-CM

## 2017-01-12 MED ORDER — ATORVASTATIN CALCIUM 20 MG PO TABS: 20.0000 mg | ORAL_TABLET | Freq: Every day | ORAL | 1 refills | Status: DC

## 2017-05-11 ENCOUNTER — Ambulatory Visit (INDEPENDENT_AMBULATORY_CARE_PROVIDER_SITE_OTHER): Payer: PRIVATE HEALTH INSURANCE | Admitting: Family Medicine

## 2017-05-11 ENCOUNTER — Encounter: Payer: Self-pay | Admitting: Family Medicine

## 2017-05-11 VITALS — BP 120/78 | HR 77 | Temp 98.8°F | Resp 16 | Ht 68.0 in | Wt 218.0 lb

## 2017-05-11 DIAGNOSIS — E669 Obesity, unspecified: Secondary | ICD-10-CM

## 2017-05-11 DIAGNOSIS — R5383 Other fatigue: Secondary | ICD-10-CM | POA: Diagnosis not present

## 2017-05-11 LAB — CBC WITH DIFFERENTIAL/PLATELET
Basophils Absolute: 0 cells/uL (ref 0–200)
Basophils Relative: 0 %
Eosinophils Absolute: 252 cells/uL (ref 15–500)
Eosinophils Relative: 3 %
HEMATOCRIT: 35.7 % (ref 35.0–45.0)
Hemoglobin: 11.7 g/dL (ref 11.7–15.5)
Lymphocytes Relative: 27 %
Lymphs Abs: 2268 cells/uL (ref 850–3900)
MCH: 28.5 pg (ref 27.0–33.0)
MCHC: 32.8 g/dL (ref 32.0–36.0)
MCV: 86.9 fL (ref 80.0–100.0)
MONO ABS: 588 {cells}/uL (ref 200–950)
MPV: 9.4 fL (ref 7.5–12.5)
Monocytes Relative: 7 %
NEUTROS ABS: 5292 {cells}/uL (ref 1500–7800)
Neutrophils Relative %: 63 %
Platelets: 280 10*3/uL (ref 140–400)
RBC: 4.11 MIL/uL (ref 3.80–5.10)
RDW: 13.8 % (ref 11.0–15.0)
WBC: 8.4 10*3/uL (ref 3.8–10.8)

## 2017-05-11 LAB — BASIC METABOLIC PANEL
BUN: 19 mg/dL (ref 7–25)
CHLORIDE: 103 mmol/L (ref 98–110)
CO2: 22 mmol/L (ref 20–31)
CREATININE: 0.59 mg/dL (ref 0.50–1.10)
Calcium: 8.8 mg/dL (ref 8.6–10.2)
GLUCOSE: 87 mg/dL (ref 65–99)
Potassium: 4.5 mmol/L (ref 3.5–5.3)
Sodium: 135 mmol/L (ref 135–146)

## 2017-05-11 LAB — TSH: TSH: 1.45 mIU/L

## 2017-05-11 NOTE — Progress Notes (Signed)
   Subjective:    Patient ID: Jenny Little, female    DOB: 08/11/1976, 41 y.o.   MRN: [id number]  HPI  'I feel sick'- pt developed sudden weakness on Saturday after exercise.  + sore throat.  Denies sinus pain/pressure.  L ear pressure.  Had tooth pain the other day but not currently.  No fevers.  + sick contacts w/ viral illnesses.  No N/V.  Pt reports feeling better than she did on Saturday.   Review of Systems For ROS see HPI     Objective:   Physical Exam  Constitutional: She is oriented to person, place, and time. She appears well-developed and well-nourished. No distress.  obese  HENT:  Head: Normocephalic and atraumatic.  Right Ear: Tympanic membrane normal.  Left Ear: Tympanic membrane normal.  Nose: Mucosal edema and rhinorrhea present. Right sinus exhibits no maxillary sinus tenderness and no frontal sinus tenderness. Left sinus exhibits no maxillary sinus tenderness and no frontal sinus tenderness.  Mouth/Throat: Mucous membranes are normal. Posterior oropharyngeal erythema (w/ PND) present.  Eyes: Conjunctivae and EOM are normal. Pupils are equal, round, and reactive to light.  Neck: Normal range of motion. Neck supple.  Cardiovascular: Normal rate, regular rhythm and normal heart sounds.   Pulmonary/Chest: Effort normal and breath sounds normal. No respiratory distress. She has no wheezes. She has no rales.  Lymphadenopathy:    She has no cervical adenopathy.  Neurological: She is alert and oriented to person, place, and time.  Skin: Skin is warm and dry.  Psychiatric: She has a normal mood and affect. Her behavior is normal. Thought content normal.  Vitals reviewed.         Assessment & Plan:  Fatigue- deteriorated.  Pt reports sudden onset fatigue on Saturday.  I suspect this is a viral illness as both of her children were recently sick but I will get labs to r/o metabolic cause of fatigue.  Stressed need for healthy diet and regular exercise as she has gained 17  lbs since Feb.  She may be peri-menopausal as she does note changes to her menstrual cycles.

## 2017-05-11 NOTE — Patient Instructions (Signed)
Follow up as needed/scheduled We'll notify you of your lab results and make any changes if needed Continue to work on healthy diet and regular exercise- you can do it! I suspect your current fatigue is due to a viral illness and should improve w/ time but we'll check labs just to be sure Call with any questions or concerns Have a great summer!!!

## 2017-05-11 NOTE — Progress Notes (Signed)
Pre visit review using our clinic review tool, if applicable. No additional management support is needed unless otherwise documented below in the visit note. 

## 2017-05-12 ENCOUNTER — Encounter: Payer: Self-pay | Admitting: Family Medicine

## 2017-05-13 MED ORDER — AMPHETAMINE-DEXTROAMPHET ER 10 MG PO CP24: 10.0000 mg | ORAL_CAPSULE | Freq: Every day | ORAL | 0 refills | Status: DC

## 2017-05-22 ENCOUNTER — Ambulatory Visit: Payer: PRIVATE HEALTH INSURANCE | Admitting: Physician Assistant

## 2017-07-16 ENCOUNTER — Other Ambulatory Visit: Payer: Self-pay | Admitting: Family Medicine

## 2017-07-30 ENCOUNTER — Other Ambulatory Visit: Payer: Self-pay | Admitting: Family Medicine

## 2017-07-30 ENCOUNTER — Encounter: Payer: Self-pay | Admitting: Family Medicine

## 2017-07-30 MED ORDER — AMPHETAMINE-DEXTROAMPHET ER 10 MG PO CP24: 10.0000 mg | ORAL_CAPSULE | Freq: Every day | ORAL | 0 refills | Status: DC

## 2017-07-30 NOTE — Telephone Encounter (Signed)
Pt informed, rx is available at front desk for pick up.

## 2017-07-30 NOTE — Telephone Encounter (Signed)
Last OV 05/11/17 adderall last filled 05/13/17 #30 with 0

## 2017-07-30 NOTE — Telephone Encounter (Signed)
Pt needs refill on adderall °

## 2017-08-11 ENCOUNTER — Ambulatory Visit: Payer: Self-pay | Admitting: Family Medicine

## 2017-08-13 ENCOUNTER — Ambulatory Visit (INDEPENDENT_AMBULATORY_CARE_PROVIDER_SITE_OTHER): Payer: PRIVATE HEALTH INSURANCE | Admitting: Family Medicine

## 2017-08-13 ENCOUNTER — Encounter: Payer: Self-pay | Admitting: Family Medicine

## 2017-08-13 VITALS — BP 123/80 | HR 78 | Temp 98.3°F | Resp 16 | Ht 68.0 in | Wt 217.5 lb

## 2017-08-13 DIAGNOSIS — B9689 Other specified bacterial agents as the cause of diseases classified elsewhere: Secondary | ICD-10-CM

## 2017-08-13 DIAGNOSIS — J019 Acute sinusitis, unspecified: Secondary | ICD-10-CM | POA: Diagnosis not present

## 2017-08-13 MED ORDER — AMOXICILLIN 875 MG PO TABS
875.0000 mg | ORAL_TABLET | Freq: Two times a day (BID) | ORAL | 0 refills | Status: DC
Start: 2017-08-13 — End: 2017-10-06

## 2017-08-13 NOTE — Progress Notes (Signed)
Pre visit review using our clinic review tool, if applicable. No additional management support is needed unless otherwise documented below in the visit note. 

## 2017-08-13 NOTE — Patient Instructions (Signed)
Follow up as needed/scheduled Start the Amoxicillin twice daily- take w/ food Drink plenty of fluids Add daily Claritin or Zyrtec (store brand generic works just as well) Call with any questions or concerns Happy Early [place] Ouch!!

## 2017-08-13 NOTE — Progress Notes (Signed)
   Subjective:    Patient ID: Jenny Little, female    DOB: February 10, 1976, 41 y.o.   MRN: [id number]  HPI Ear pain- 'it started off feeling as a head cold' and is now feeling 'really weak', 'pressure in my head'.  No fever.  Pt reports bilateral ear pressure and maxillary sinus pressure.  No tooth pain.  No N/V.  + nasal congestion.  + sick contacts.  Mild cough.  sxs started 3 days ago and have been worsening.   Review of Systems For ROS see HPI     Objective:   Physical Exam  Constitutional: She appears well-developed and well-nourished. No distress.  HENT:  Head: Normocephalic and atraumatic.  Right Ear: Tympanic membrane normal.  Left Ear: Tympanic membrane normal.  Nose: Mucosal edema and rhinorrhea present. Right sinus exhibits maxillary sinus tenderness. Right sinus exhibits no frontal sinus tenderness. Left sinus exhibits maxillary sinus tenderness. Left sinus exhibits no frontal sinus tenderness.  Mouth/Throat: Uvula is midline and mucous membranes are normal. Posterior oropharyngeal erythema present. No oropharyngeal exudate.  Eyes: Pupils are equal, round, and reactive to light. Conjunctivae and EOM are normal.  Neck: Normal range of motion. Neck supple.  Cardiovascular: Normal rate, regular rhythm and normal heart sounds.   Pulmonary/Chest: Effort normal and breath sounds normal. No respiratory distress. She has no wheezes.  Lymphadenopathy:    She has no cervical adenopathy.  Vitals reviewed.         Assessment & Plan:  Sinusitis- new.  Pt's sxs and PE consistent w/ infxn.  Start abx.  Add daily antihistamine to improve nasal congestion and sinus pressure.  Reviewed supportive care and red flags that should prompt return.  Pt expressed understanding and is in agreement w/ plan.

## 2017-08-18 ENCOUNTER — Ambulatory Visit: Payer: PRIVATE HEALTH INSURANCE | Admitting: Family Medicine

## 2017-10-05 ENCOUNTER — Telehealth: Payer: Self-pay | Admitting: Family Medicine

## 2017-10-05 NOTE — Telephone Encounter (Signed)
Pt asking what could see take OTC for a sinus infection, pt states that insurance will only allow 4 sick visit, so she is trying to avoid coming in.

## 2017-10-05 NOTE — Telephone Encounter (Signed)
Called pt and advised in a detailed voicemail that Per verbal discussion with PCP, pt should try OTC claritin or zyrtec, flonase and phenylephinrine.

## 2017-10-06 ENCOUNTER — Encounter: Payer: Self-pay | Admitting: Physician Assistant

## 2017-10-06 ENCOUNTER — Ambulatory Visit (INDEPENDENT_AMBULATORY_CARE_PROVIDER_SITE_OTHER): Payer: PRIVATE HEALTH INSURANCE | Admitting: Physician Assistant

## 2017-10-06 ENCOUNTER — Other Ambulatory Visit: Payer: Self-pay

## 2017-10-06 VITALS — BP 100/60 | HR 74 | Temp 98.3°F | Resp 14 | Ht 68.0 in | Wt 215.0 lb

## 2017-10-06 DIAGNOSIS — J019 Acute sinusitis, unspecified: Secondary | ICD-10-CM

## 2017-10-06 DIAGNOSIS — B9689 Other specified bacterial agents as the cause of diseases classified elsewhere: Secondary | ICD-10-CM | POA: Diagnosis not present

## 2017-10-06 MED ORDER — AMOXICILLIN-POT CLAVULANATE 875-125 MG PO TABS: 1.0000 | ORAL_TABLET | Freq: Two times a day (BID) | ORAL | 0 refills | Status: DC

## 2017-10-06 NOTE — Progress Notes (Signed)
Pre visit review using our clinic review tool, if applicable. No additional management support is needed unless otherwise documented below in the visit note. 

## 2017-10-06 NOTE — Progress Notes (Signed)
Patient presents to clinic today c/o 1 week of sinus pressure, sinus pain, and cough that is worse at night. Notes thick sinus drainage that is dark green. Denies fever or chills. Denies recent travel. Is taking Zyrtec to help with sometimes.   Past Medical History:  Diagnosis Date  . ADHD (attention deficit hyperactivity disorder)   . Allergy   . Anxiety   . Depression   . Fracture, toe   . Iron deficiency anemia   . Meningitis spinal    bacterial-child  . Migraine     Current Outpatient Medications on File Prior to Visit  Medication Sig Dispense Refill  . cholecalciferol (VITAMIN D) 1000 units tablet Take 1,000 Units by mouth daily.    . citalopram (CELEXA) 20 MG tablet TAKE 1 TABLET (20 MG TOTAL) BY MOUTH DAILY. 90 tablet 1  . Cyanocobalamin (VITAMIN B 12 PO) Take by mouth.    . ferrous sulfate (FERROUSUL) 325 (65 FE) MG tablet Take 1 tablet (325 mg total) by mouth 3 (three) times daily with meals. (Patient taking differently: Take 325 mg by mouth daily with breakfast. ) 90 tablet 1  . atorvastatin (LIPITOR) 20 MG tablet Take 1 tablet (20 mg total) by mouth daily. (Patient not taking: Reported on 08/13/2017) 30 tablet 1   No current facility-administered medications on file prior to visit.     No Known Allergies  Family History  Problem Relation Age of Onset  . Heart disease Father   . Diabetes Father   . Mental illness Father        depression  . AAA (abdominal aortic aneurysm) Father [id number]       died   . Hypertension Brother   . Colon polyps Mother        x many, non-cancerous  . Colon cancer Maternal Grandmother 40       died of colon cancer  . Early death Maternal Grandmother   . Colon cancer Paternal Grandmother [id number]       metz to bone  . Early death Paternal Uncle   . Heart disease Paternal Uncle   . Heart disease Maternal Grandfather   . Stroke Paternal Grandfather     Social History   Socioeconomic History  . Marital status: Married    Spouse name: None    . Number of children: 2  . Years of education: None  . Highest education level: None  Social Needs  . Financial resource strain: None  . Food insecurity - worry: None  . Food insecurity - inability: None  . Transportation needs - medical: None  . Transportation needs - non-medical: None  Occupational History  . Occupation: real Insurance account managerestate agent  Tobacco Use  . Smoking status: Never Smoker  . Smokeless tobacco: Never Used  Substance and Sexual Activity  . Alcohol use: No  . Drug use: No  . Sexual activity: Yes    Comment: female   Other Topics Concern  . None  Social History Narrative   Married. Husband's name Onalee HuaDavid. 2 children, Trinna PostAlex and Marden NobleKemp.   Bachelor's in social work. Employed full-time as a Customer service managerreal estate agent.   [place]   Takes a daily vitamin, wears her seatbelt, wears a bike helmet, exercises at least 3 times a week.   Smoke detector located in the home.   No firearms in the home.   Patient feels safe in her relationship.   Review of Systems - See HPI.  All other ROS are  negative.  BP 100/60   Pulse 74   Temp 98.3 F (36.8 C) (Oral)   Resp 14   Ht 5\' 8"  (1.727 m)   Wt 215 lb (97.5 kg)   SpO2 98%   BMI 32.69 kg/m   Physical Exam  Constitutional: She is oriented to person, place, and time and well-developed, well-nourished, and in no distress.  HENT:  Head: Normocephalic and atraumatic.  Right Ear: Tympanic membrane and external ear normal.  Left Ear: Tympanic membrane and external ear normal.  Nose: Mucosal edema and rhinorrhea present. Right sinus exhibits frontal sinus tenderness. Left sinus exhibits frontal sinus tenderness.  Mouth/Throat: Uvula is midline, oropharynx is clear and moist and mucous membranes are normal. No oropharyngeal exudate.  Eyes: Conjunctivae are normal.  Neck: Neck supple.  Cardiovascular: Normal rate, regular rhythm, normal heart sounds and intact distal pulses.  Pulmonary/Chest: Effort normal and breath  sounds normal. No respiratory distress. She has no wheezes. She has no rales. She exhibits no tenderness.  Lymphadenopathy:    She has no cervical adenopathy.  Neurological: She is alert and oriented to person, place, and time.  Skin: Skin is warm and dry. No rash noted.  Psychiatric: Affect normal.  Vitals reviewed.  Assessment/Plan: 1. Acute bacterial sinusitis Rx Augmentin.  Increase fluids.  Rest.  Saline nasal spray.  Probiotic.  Mucinex as directed.  Humidifier in bedroom.  Call or return to clinic if symptoms are not improving.  - amoxicillin-clavulanate (AUGMENTIN) 875-125 MG tablet; Take 1 tablet 2 (two) times daily by mouth.  Dispense: 14 tablet; Refill: 0   Piedad ClimesMartin, Candiss Galeana Cody, [place]-C

## 2017-10-06 NOTE — Patient Instructions (Signed)
Please take antibiotic as directed.  Increase fluid intake.  Use Saline nasal spray.  Take a daily multivitamin. Continue the Zyrte as directed.  Place a humidifier in the bedroom.  Please call or return clinic if symptoms are not improving.  Sinusitis Sinusitis is redness, soreness, and swelling (inflammation) of the paranasal sinuses. Paranasal sinuses are air pockets within the bones of your face (beneath the eyes, the middle of the forehead, or above the eyes). In healthy paranasal sinuses, mucus is able to drain out, and air is able to circulate through them by way of your nose. However, when your paranasal sinuses are inflamed, mucus and air can become trapped. This can allow bacteria and other germs to grow and cause infection. Sinusitis can develop quickly and last only a short time (acute) or continue over a long period (chronic). Sinusitis that lasts for more than 12 weeks is considered chronic.  CAUSES  Causes of sinusitis include:  Allergies.  Structural abnormalities, such as displacement of the cartilage that separates your nostrils (deviated septum), which can decrease the air flow through your nose and sinuses and affect sinus drainage.  Functional abnormalities, such as when the small hairs (cilia) that line your sinuses and help remove mucus do not work properly or are not present. SYMPTOMS  Symptoms of acute and chronic sinusitis are the same. The primary symptoms are pain and pressure around the affected sinuses. Other symptoms include:  Upper toothache.  Earache.  Headache.  Bad breath.  Decreased sense of smell and taste.  A cough, which worsens when you are lying flat.  Fatigue.  Fever.  Thick drainage from your nose, which often is green and may contain pus (purulent).  Swelling and warmth over the affected sinuses. DIAGNOSIS  Your caregiver will perform a physical exam. During the exam, your caregiver may:  Look in your nose for signs of abnormal growths  in your nostrils (nasal polyps).  Tap over the affected sinus to check for signs of infection.  View the inside of your sinuses (endoscopy) with a special imaging device with a light attached (endoscope), which is inserted into your sinuses. If your caregiver suspects that you have chronic sinusitis, one or more of the following tests may be recommended:  Allergy tests.  Nasal culture A sample of mucus is taken from your nose and sent to a lab and screened for bacteria.  Nasal cytology A sample of mucus is taken from your nose and examined by your caregiver to determine if your sinusitis is related to an allergy. TREATMENT  Most cases of acute sinusitis are related to a viral infection and will resolve on their own within 10 days. Sometimes medicines are prescribed to help relieve symptoms (pain medicine, decongestants, nasal steroid sprays, or saline sprays).  However, for sinusitis related to a bacterial infection, your caregiver will prescribe antibiotic medicines. These are medicines that will help kill the bacteria causing the infection.  Rarely, sinusitis is caused by a fungal infection. In theses cases, your caregiver will prescribe antifungal medicine. For some cases of chronic sinusitis, surgery is needed. Generally, these are cases in which sinusitis recurs more than 3 times per year, despite other treatments. HOME CARE INSTRUCTIONS   Drink plenty of water. Water helps thin the mucus so your sinuses can drain more easily.  Use a humidifier.  Inhale steam 3 to 4 times a day (for example, sit in the bathroom with the shower running).  Apply a warm, moist washcloth to your face 3  to 4 times a day, or as directed by your caregiver.  Use saline nasal sprays to help moisten and clean your sinuses.  Take over-the-counter or prescription medicines for pain, discomfort, or fever only as directed by your caregiver. SEEK IMMEDIATE MEDICAL CARE IF:  You have increasing pain or severe  headaches.  You have nausea, vomiting, or drowsiness.  You have swelling around your face.  You have vision problems.  You have a stiff neck.  You have difficulty breathing. MAKE SURE YOU:   Understand these instructions.  Will watch your condition.  Will get help right away if you are not doing well or get worse. Document Released: 11/10/2005 Document Revised: 02/02/2012 Document Reviewed: 11/25/2011 [facility] Patient Information 2014 [place], [place].

## 2017-10-28 ENCOUNTER — Ambulatory Visit: Payer: PRIVATE HEALTH INSURANCE | Admitting: Physician Assistant

## 2017-10-29 ENCOUNTER — Ambulatory Visit: Payer: PRIVATE HEALTH INSURANCE | Admitting: Physician Assistant

## 2018-01-11 ENCOUNTER — Encounter: Payer: Self-pay | Admitting: General Practice

## 2018-01-19 ENCOUNTER — Encounter: Payer: Self-pay | Admitting: Family Medicine

## 2018-01-19 ENCOUNTER — Other Ambulatory Visit: Payer: Self-pay

## 2018-01-19 ENCOUNTER — Ambulatory Visit (INDEPENDENT_AMBULATORY_CARE_PROVIDER_SITE_OTHER): Payer: PRIVATE HEALTH INSURANCE | Admitting: Family Medicine

## 2018-01-19 VITALS — BP 110/78 | HR 80 | Temp 98.2°F | Ht 68.0 in | Wt 220.2 lb

## 2018-01-19 DIAGNOSIS — H6982 Other specified disorders of Eustachian tube, left ear: Secondary | ICD-10-CM

## 2018-01-19 DIAGNOSIS — J309 Allergic rhinitis, unspecified: Secondary | ICD-10-CM

## 2018-01-19 MED ORDER — FLUTICASONE PROPIONATE 50 MCG/ACT NA SUSP: 1.0000 | Freq: Every day | NASAL | 6 refills | Status: DC

## 2018-01-19 NOTE — Progress Notes (Signed)
Subjective  CC:  Chief Complaint  Patient presents with  . Ear Pain    Bilateral ear pain x 3 days     HPI: Jenny Little is a 42 y.o. female who presents to the office today to address the problems listed above in the chief complaint.  As noted above, patient complains of ear pain worse on the left over the last 3 days.  She has been using her son over-the-counter antibiotic drops without significant change in symptoms.  She has a history of allergies that are active with nasal congestion.  She describes the pain is sharp and nonradiating.  She denies sore throat.  She also complains of feeling achy with some malaise, without fever, sore throat, cough, shortness of breath, nausea, vomiting, diarrhea, or abdominal pain. I reviewed the patients updated PMH, FH, and SocHx.    Patient Active Problem List   Diagnosis Date Noted  . Generalized anxiety disorder 05/15/2016  . Irregular menses 05/15/2016  . Weight gain 02/25/2016  . FH: colon cancer 02/25/2016  . Maxillary sinusitis, acute 01/07/2016  . Anemia 11/13/2015  . Encounter to establish care 10/25/2015  . Attention deficit hyperactivity disorder (ADHD) 10/25/2015  . Fatigue 10/25/2015  . Breast cancer screening 10/25/2015  . Encounter for preventive health examination 10/25/2015  . Obesity (BMI 30.0-34.9) 10/25/2015   Current Meds  Medication Sig  . cholecalciferol (VITAMIN D) 1000 units tablet Take 1,000 Units by mouth daily.  . citalopram (CELEXA) 20 MG tablet TAKE 1 TABLET (20 MG TOTAL) BY MOUTH DAILY.  Marland Kitchen Cyanocobalamin (VITAMIN B 12 PO) Take by mouth.  . ferrous sulfate (FERROUSUL) 325 (65 FE) MG tablet Take 1 tablet (325 mg total) by mouth 3 (three) times daily with meals. (Patient taking differently: Take 325 mg by mouth daily with breakfast. )    Allergies: Patient has No Known Allergies. Family History: Patient family history includes AAA (abdominal aortic aneurysm) (age of onset: 61) in her father; Colon cancer  (age of onset: 67) in her maternal grandmother; Colon cancer (age of onset: 5) in her paternal grandmother; Colon polyps in her mother; Diabetes in her father; Early death in her maternal grandmother and paternal uncle; Heart disease in her father, maternal grandfather, and paternal uncle; Hypertension in her brother; Mental illness in her father; Stroke in her paternal grandfather. Social History:  Patient  reports that  has never smoked. she has never used smokeless tobacco. She reports that she does not drink alcohol or use drugs.  Review of Systems: Constitutional: Negative for fever malaise or anorexia Cardiovascular: negative for chest pain Respiratory: negative for SOB or persistent cough Gastrointestinal: negative for abdominal pain  Objective  Vitals: BP 110/78   Pulse 80   Temp 98.2 F (36.8 C)   Ht 5\' 8"  (1.727 m)   Wt 220 lb 3.2 oz (99.9 kg)   BMI 33.48 kg/m  General: no acute distress , A&Ox3 HEENT: PEERL, conjunctiva normal, bilateral TMs with serous effusions but otherwise normal, external auditory canals, nasal congestion present, no TMJ tenderness, oropharynx moist,neck is supple Cardiovascular:  RRR without murmur or gallop.  Respiratory:  Good breath sounds bilaterally, CTAB with normal respiratory effort Skin:  Warm, no rashes  Assessment  1. Dysfunction of left eustachian tube   2. Allergic rhinitis, unspecified seasonality, unspecified trigger      Plan   Eustachian tube dysfunction and active allergies: Education and counseling done.  It is possible that she is coming down with an early viral infection  as well but too soon to tell.  We will have to monitor her symptoms.  Treat allergies by adding Flonase.  Discussed Valsalva maneuver to decrease pressure related pain.  Follow-up if persists.     Commons side effects, risks, benefits, and alternatives for medications and treatment plan prescribed today were discussed, and the patient expressed understanding  of the given instructions. Patient is instructed to call or message via MyChart if he/she has any questions or concerns regarding our treatment plan. No barriers to understanding were identified. We discussed Red Flag symptoms and signs in detail. Patient expressed understanding regarding what to do in case of urgent or emergency type symptoms.   Medication list was reconciled, printed and provided to the patient in AVS. Patient instructions and summary information was reviewed with the patient as documented in the AVS. This note was prepared with assistance of Dragon voice recognition software. Occasional wrong-word or sound-a-like substitutions may have occurred due to the inherent limitations of voice recognition software  No orders of the defined types were placed in this encounter.  Meds ordered this encounter  Medications  . fluticasone (FLONASE) 50 MCG/ACT nasal spray    Sig: Place 1 spray into both nostrils daily.    Dispense:  16 g    Refill:  6

## 2018-01-19 NOTE — Patient Instructions (Signed)
Please follow up if symptoms do not improve or as needed.    Eustachian Tube Dysfunction The eustachian tube connects the middle ear to the back of the nose. It regulates air pressure in the middle ear by allowing air to move between the ear and nose. It also helps to drain fluid from the middle ear space. When the eustachian tube does not function properly, air pressure, fluid, or both can build up in the middle ear. Eustachian tube dysfunction can affect one or both ears. What are the causes? This condition happens when the eustachian tube becomes blocked or cannot open normally. This may result from:  Ear infections.  Colds and other upper respiratory infections.  Allergies.  Irritation, such as from cigarette smoke or acid from the stomach coming up into the esophagus (gastroesophageal reflux).  Sudden changes in air pressure, such as from descending in an airplane.  Abnormal growths in the nose or throat, such as nasal polyps, tumors, or enlarged tissue at the back of the throat (adenoids).  What increases the risk? This condition may be more likely to develop in people who smoke and people who are overweight. Eustachian tube dysfunction may also be more likely to develop in children, especially children who have:  Certain birth defects of the mouth, such as cleft palate.  Large tonsils and adenoids.  What are the signs or symptoms? Symptoms of this condition may include:  A feeling of fullness in the ear.  Ear pain.  Clicking or popping noises in the ear.  Ringing in the ear.  Hearing loss.  Loss of balance.  Symptoms may get worse when the air pressure around you changes, such as when you travel to an area of high elevation or fly on an airplane. How is this diagnosed? This condition may be diagnosed based on:  Your symptoms.  A physical exam of your ear, nose, and throat.  Tests, such as those that measure: ? The movement of your eardrum  (tympanogram). ? Your hearing (audiometry).  How is this treated? Treatment depends on the cause and severity of your condition. If your symptoms are mild, you may be able to relieve your symptoms by moving air into ("popping") your ears. If you have symptoms of fluid in your ears, treatment may include:  Decongestants.  Antihistamines.  Nasal sprays or ear drops that contain medicines that reduce swelling (steroids).  In some cases, you may need to have a procedure to drain the fluid in your eardrum (myringotomy). In this procedure, a small tube is placed in the eardrum to:  Drain the fluid.  Restore the air in the middle ear space.  Follow these instructions at home:  Take over-the-counter and prescription medicines only as told by your health care provider.  Use techniques to help pop your ears as recommended by your health care provider. These may include: ? Chewing gum. ? Yawning. ? Frequent, forceful swallowing. ? Closing your mouth, holding your nose closed, and gently blowing as if you are trying to blow air out of your nose.  Do not do any of the following until your health care provider approves: ? Travel to high altitudes. ? Fly in airplanes. ? Work in a pressurized cabin or room. ? Scuba dive.  Keep your ears dry. Dry your ears completely after showering or bathing.  Do not smoke.  Keep all follow-up visits as told by your health care provider. This is important. Contact a health care provider if:  Your symptoms do not   go away after treatment.  Your symptoms come back after treatment.  You are unable to pop your ears.  You have: ? A fever. ? Pain in your ear. ? Pain in your head or neck. ? Fluid draining from your ear.  Your hearing suddenly changes.  You become very dizzy.  You lose your balance. This information is not intended to replace advice given to you by your health care provider. Make sure you discuss any questions you have with your  health care provider. Document Released: 12/07/2015 Document Revised: 04/17/2016 Document Reviewed: 11/29/2014 Elsevier Interactive Patient Education  2018 [employer]  

## 2018-03-31 ENCOUNTER — Ambulatory Visit: Payer: PRIVATE HEALTH INSURANCE | Admitting: Family Medicine

## 2018-04-06 ENCOUNTER — Encounter: Payer: Self-pay | Admitting: Family Medicine

## 2018-04-08 ENCOUNTER — Encounter: Payer: Self-pay | Admitting: Family Medicine

## 2018-04-08 ENCOUNTER — Ambulatory Visit (INDEPENDENT_AMBULATORY_CARE_PROVIDER_SITE_OTHER): Payer: PRIVATE HEALTH INSURANCE | Admitting: Family Medicine

## 2018-04-08 ENCOUNTER — Encounter: Payer: Self-pay | Admitting: General Practice

## 2018-04-08 ENCOUNTER — Other Ambulatory Visit: Payer: Self-pay

## 2018-04-08 VITALS — BP 108/64 | HR 72 | Temp 97.7°F | Resp 17 | Ht 68.0 in | Wt 219.0 lb

## 2018-04-08 DIAGNOSIS — R5383 Other fatigue: Secondary | ICD-10-CM

## 2018-04-08 DIAGNOSIS — F902 Attention-deficit hyperactivity disorder, combined type: Secondary | ICD-10-CM | POA: Diagnosis not present

## 2018-04-08 MED ORDER — AMPHETAMINE-DEXTROAMPHETAMINE 10 MG PO TABS: 10.0000 mg | ORAL_TABLET | Freq: Two times a day (BID) | ORAL | 0 refills | Status: DC

## 2018-04-08 NOTE — Progress Notes (Signed)
   Subjective:    Patient ID: Jenny Little, female    DOB: [birth year], 42 y.o.   MRN: [id number]  HPI ADHD- pt is again having trouble w/ task completion.  Pt states home and office are a mess, 'you should see the dysfunction of it'.  The busier pt gets, the harder it is to focus.  Pt will stop mid sentence b/c she has already moved on to the next 2-3 thoughts.  Was previously on Adderall- would like to start at a lower dose  Fatigue- 'I just feel like I don't have energy'.  Pt reports sleeping well.  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  Pt finds her ADHD exhausting.  Wonders if restarting ADHD meds would improve her energy level.   Review of Systems For ROS see HPI     Objective:   Physical Exam  Constitutional: She is oriented to person, place, and time. She appears well-developed and well-nourished. No distress.  obese  HENT:  Head: Normocephalic and atraumatic.  Eyes: Pupils are equal, round, and reactive to light. Conjunctivae and EOM are normal.  Neck: Normal range of motion. Neck supple. No thyromegaly present.  Cardiovascular: Normal rate, regular rhythm, normal heart sounds and intact distal pulses.  No murmur heard. Pulmonary/Chest: Effort normal and breath sounds normal. No respiratory distress.  Abdominal: Soft. She exhibits no distension. There is no tenderness.  Musculoskeletal: She exhibits no edema.  Lymphadenopathy:    She has no cervical adenopathy.  Neurological: She is alert and oriented to person, place, and time.  Skin: Skin is warm and dry.  Psychiatric: She has a normal mood and affect. Her behavior is normal.  Vitals reviewed.         Assessment & Plan:

## 2018-04-08 NOTE — Patient Instructions (Signed)
Follow up in 3-4 weeks to recheck energy and ADHD START the Adderall twice daily- I suspect this will improve both your focus and your fatigue If the fatigue does not improve, we can check labs at the next visit Continue to work on healthy diet and regular exercise- this will help your stress level and your energy level Call with any questions or concerns Hang in there!!

## 2018-04-14 NOTE — Assessment & Plan Note (Signed)
Deteriorated.  Pt is having issues w/ task completion .  She feels her life is 'dysfunctional'.  Interested in starting Adderall at the lowest dose possible.  Prescription provided, will follow closely.

## 2018-04-14 NOTE — Assessment & Plan Note (Signed)
New.  Offered pt labs today to determine if there is a metabolic cause of her fatigue but she declined due to her insurance.  Pt prefers to see if treating her ADHD improves her fatigue before paying for labs.  Will follow.

## 2018-04-27 ENCOUNTER — Other Ambulatory Visit: Payer: Self-pay

## 2018-04-27 ENCOUNTER — Encounter: Payer: Self-pay | Admitting: Family Medicine

## 2018-04-27 ENCOUNTER — Ambulatory Visit (INDEPENDENT_AMBULATORY_CARE_PROVIDER_SITE_OTHER): Payer: PRIVATE HEALTH INSURANCE | Admitting: Family Medicine

## 2018-04-27 VITALS — BP 121/81 | HR 76 | Temp 98.0°F | Resp 16 | Ht 68.0 in | Wt 219.1 lb

## 2018-04-27 DIAGNOSIS — F902 Attention-deficit hyperactivity disorder, combined type: Secondary | ICD-10-CM

## 2018-04-27 MED ORDER — AMPHETAMINE-DEXTROAMPHETAMINE 10 MG PO TABS: 10.0000 mg | ORAL_TABLET | Freq: Three times a day (TID) | ORAL | 0 refills | Status: DC

## 2018-04-27 NOTE — Progress Notes (Signed)
   Subjective:    Patient ID: Jenny Little, female    DOB: 04/22/1976, 42 y.o.   MRN: [id number]  HPI ADHD- pt was started on Adderall 10mg  BID last visit.  For 4-5 days sxs were excellently controlled and then she found the medication wearing off too early in the day.  She adjusted her dose to TID and sxs are much better controlled.  No palpitations, no anorexia, no insomnia.  Pt reports sxs are much better controlled, energy level is back to normal.   Review of Systems For ROS see HPI     Objective:   Physical Exam  Constitutional: She is oriented to person, place, and time. She appears well-developed and well-nourished. No distress.  HENT:  Head: Normocephalic and atraumatic.  Eyes: Pupils are equal, round, and reactive to light. Conjunctivae and EOM are normal.  Neck: Normal range of motion. Neck supple. No thyromegaly present.  Cardiovascular: Normal rate, regular rhythm, normal heart sounds and intact distal pulses.  No murmur heard. Pulmonary/Chest: Effort normal and breath sounds normal. No respiratory distress.  Musculoskeletal: She exhibits no edema.  Lymphadenopathy:    She has no cervical adenopathy.  Neurological: She is alert and oriented to person, place, and time.  Skin: Skin is warm and dry.  Psychiatric: She has a normal mood and affect. Her behavior is normal.  Vitals reviewed.         Assessment & Plan:

## 2018-04-27 NOTE — Patient Instructions (Signed)
Schedule your complete physical in 6 months Your refill is available to pick up when you need it Increase to 1 tab 3x/day- this seems to be the sweet spot! Call with any questions or concerns Have a great summer!!

## 2018-04-27 NOTE — Assessment & Plan Note (Signed)
Much improved since starting Adderall.  Pt had to increase to TID to extend the effectiveness of medication- it was wearing off too quickly.  Prescription changed to reflect TID dosing.

## 2018-05-04 ENCOUNTER — Telehealth: Payer: Self-pay | Admitting: Family Medicine

## 2018-05-04 MED ORDER — AMPHETAMINE-DEXTROAMPHETAMINE 10 MG PO TABS: 10.0000 mg | ORAL_TABLET | Freq: Three times a day (TID) | ORAL | 0 refills | Status: DC

## 2018-05-04 NOTE — Telephone Encounter (Signed)
I sent this in on 6/4- so if they're out of stock, they should have it back in.  Also, since I sent this electronically, we would need to CANCEL the original prescription at the original pharmacy before I send in anything else.

## 2018-05-04 NOTE — Telephone Encounter (Signed)
Copied from CRM [phone]. Topic: Quick Communication - Rx Refill/Question >> May 04, 2018  9:20 AM Maia Pettiesrtiz, Kristie S wrote: Medication: [facility]all [facility] [address] [facility] - pt would like new RX sent to different pharmacy Preferred Pharmacy (with phone number or street name): [id number]- [place], [place] - [address] [phone] (Phone) [phone] (Fax)

## 2018-05-04 NOTE — Telephone Encounter (Signed)
Called and spoke with pharmacy. They advised that they do not have enough in stock to fill. They are canceling the prescription. [facility] [address].

## 2018-05-06 ENCOUNTER — Other Ambulatory Visit: Payer: Self-pay | Admitting: Family Medicine

## 2018-05-06 MED ORDER — AMPHETAMINE-DEXTROAMPHETAMINE 10 MG PO TABS: 10.0000 mg | ORAL_TABLET | Freq: Three times a day (TID) | ORAL | 0 refills | Status: DC

## 2018-05-06 NOTE — Telephone Encounter (Signed)
Adderall 10 mg be sent to a different pharmacy as her pharmacy is out.  [facility]  on [address] [place], [place]  Dr. Rennis Goldenabori's pt

## 2018-05-06 NOTE — Telephone Encounter (Signed)
Copied from CRM [id number]. Topic: Quick Communication - Rx Refill/Question >> May 04, 2018  9:20 AM Maia Pettiesrtiz, Kristie S wrote: Medication: [facility]derall - [facility] [address] [facility] - pt would like new RX sent to different pharmacy Preferred Pharmacy (with phone number or street name): [id number] [address] [address]- [place], [place] - [address] [phone] (Phone) [phone] (Fax)  >> May 06, 2018  9:27 AM Mare LoanBurton, Donna F wrote: Pt called back to let office know that [facility] in [facility] of adderall   She has checked and [facility] [address] [facility] the adderall and pt would like it sent to this pharmacy  Best number [phone]

## 2018-06-16 ENCOUNTER — Telehealth: Payer: Self-pay | Admitting: *Deleted

## 2018-06-16 MED ORDER — AMPHETAMINE-DEXTROAMPHETAMINE 10 MG PO TABS: 10.0000 mg | ORAL_TABLET | Freq: Three times a day (TID) | ORAL | 0 refills | Status: DC

## 2018-06-16 NOTE — Telephone Encounter (Signed)
Refill granted in PCP absence.  Piedad ClimesWilliam Cody Dwain Huhn, PA-C

## 2018-06-16 NOTE — Addendum Note (Signed)
Addended by: Waldon MerlMARTIN, Fallan Mccarey C on: 06/16/2018 01:13 PM   Modules accepted: Orders

## 2018-06-16 NOTE — Telephone Encounter (Signed)
Last OV: 04/27/18 Last Fill: 05/06/18 #60, 0   Copied from CRM #[id number]. Topic: General - Other >> Jun 16, 2018  9:30 AM Elliot GaultBell, Tiffany M wrote: Relation to JY:NWGNpt:self Call back number: [phone] [facility] [place] , [place] - [id number]  Reason for call:  Patient requesting amphetamine-dextroamphetamine (ADDERALL) 10 MG tablet refill, patient informed please allow 48 to 72 hour turn around, please advise

## 2018-06-17 ENCOUNTER — Other Ambulatory Visit: Payer: Self-pay | Admitting: Physician Assistant

## 2018-06-17 ENCOUNTER — Encounter: Payer: Self-pay | Admitting: Family Medicine

## 2018-06-17 MED ORDER — AMPHETAMINE-DEXTROAMPHETAMINE 10 MG PO TABS: 10.0000 mg | ORAL_TABLET | Freq: Three times a day (TID) | ORAL | 0 refills | Status: DC

## 2018-06-17 NOTE — Telephone Encounter (Signed)
Last OV 04/27/18 adderall last filled 06/16/18 #90 with 0

## 2018-06-21 ENCOUNTER — Other Ambulatory Visit: Payer: Self-pay | Admitting: Family Medicine

## 2018-08-02 ENCOUNTER — Encounter: Payer: Self-pay | Admitting: Family Medicine

## 2018-08-02 MED ORDER — AMPHETAMINE-DEXTROAMPHETAMINE 10 MG PO TABS: 10.0000 mg | ORAL_TABLET | Freq: Three times a day (TID) | ORAL | 0 refills | Status: DC

## 2018-08-02 NOTE — Telephone Encounter (Signed)
Last OV 04/27/18 adderall last filled 06/17/18 #90 with 0

## 2018-08-13 ENCOUNTER — Encounter: Payer: Self-pay | Admitting: Family Medicine

## 2018-08-13 MED ORDER — CITALOPRAM HYDROBROMIDE 20 MG PO TABS: 20.0000 mg | ORAL_TABLET | Freq: Every day | ORAL | 1 refills | Status: DC

## 2018-08-30 ENCOUNTER — Encounter: Payer: Self-pay | Admitting: Family Medicine

## 2018-08-30 MED ORDER — AMPHETAMINE-DEXTROAMPHETAMINE 10 MG PO TABS: 10.0000 mg | ORAL_TABLET | Freq: Three times a day (TID) | ORAL | 0 refills | Status: DC

## 2018-08-30 NOTE — Telephone Encounter (Signed)
Last OV 04/27/18 adderall last filled 08/02/18 #90 with 0

## 2018-09-20 ENCOUNTER — Encounter: Payer: Self-pay | Admitting: Family Medicine

## 2018-09-20 ENCOUNTER — Ambulatory Visit (INDEPENDENT_AMBULATORY_CARE_PROVIDER_SITE_OTHER): Payer: PRIVATE HEALTH INSURANCE | Admitting: Family Medicine

## 2018-09-20 VITALS — BP 114/78 | HR 96 | Temp 98.3°F | Resp 16 | Ht 68.0 in | Wt 220.0 lb

## 2018-09-20 DIAGNOSIS — J0100 Acute maxillary sinusitis, unspecified: Secondary | ICD-10-CM | POA: Diagnosis not present

## 2018-09-20 MED ORDER — AMOXICILLIN 875 MG PO TABS: 875.0000 mg | ORAL_TABLET | Freq: Two times a day (BID) | ORAL | 0 refills | Status: AC

## 2018-09-20 NOTE — Progress Notes (Signed)
Subjective   CC:  Chief Complaint  Patient presents with  . Nasal Congestion    cough, congestion/drainage since 09/14/18    HPI: Jenny Little is a 42 y.o. female who presents to the office today to address the problems listed above in the chief complaint.  Patient reports sinus congestion and pressure with thick drainage, mild nonproductive cough, ear pressure without pain, and mild malaise.  Symptoms have been present for 8 days.Shedenies high fevers, GI symptoms, shortness of breath. Shehas had sinus infections in the past and this feels similar.  Patient is a non-smoker.  No history of asthma or COPD. + malaise  I reviewed the patients updated PMH, FH, and SocHx.    Patient Active Problem List   Diagnosis Date Noted  . Generalized anxiety disorder 05/15/2016  . Irregular menses 05/15/2016  . Weight gain 02/25/2016  . FH: colon cancer 02/25/2016  . Maxillary sinusitis, acute 01/07/2016  . Anemia 11/13/2015  . Encounter to establish care 10/25/2015  . Attention deficit hyperactivity disorder (ADHD) 10/25/2015  . Breast cancer screening 10/25/2015  . Encounter for preventive health examination 10/25/2015  . Obesity (BMI 30.0-34.9) 10/25/2015   Current Meds  Medication Sig  . amphetamine-dextroamphetamine (ADDERALL) 10 MG tablet Take 1 tablet (10 mg total) by mouth 3 (three) times daily.  . cholecalciferol (VITAMIN D) 1000 units tablet Take 1,000 Units by mouth daily.  . citalopram (CELEXA) 20 MG tablet Take 1 tablet (20 mg total) by mouth daily.  . Cyanocobalamin (VITAMIN B 12 PO) Take by mouth.  . ferrous sulfate (FERROUSUL) 325 (65 FE) MG tablet Take 1 tablet (325 mg total) by mouth 3 (three) times daily with meals. (Patient taking differently: Take 325 mg by mouth daily with breakfast. )  . fluticasone (FLONASE) 50 MCG/ACT nasal spray Place 1 spray into both nostrils daily.    Review of Systems: Cardiovascular: negative for chest pain Respiratory: negative for SOB  or persistent cough Gastrointestinal: negative for abdominal pain Genitourinary: negative for dysuria or gross hematuria  Objective  Vitals: BP 114/78   Pulse 96   Temp 98.3 F (36.8 C) (Oral)   Resp 16   Ht 5\' 8"  (1.727 m)   Wt 220 lb (99.8 kg)   SpO2 98%   BMI 33.45 kg/m  General: no acute distress  Psych:  Alert and oriented, normal mood and affect HEENT:  Normocephalic, atraumatic, TMs with serous effusions or retraction w/o erythema, nasal mucosa is red with purulent drainage, tender maxillary sinus present L>R, OP mild erythematous w/o eudate, supple neck without LAD Cardiovascular:  RRR without murmur or gallop. no peripheral edema Respiratory:  Good breath sounds bilaterally, CTAB with normal respiratory effort Skin:  Warm, no rashes Neurologic:   Mental status is normal. normal gait  Assessment  1. Acute non-recurrent maxillary sinusitis      Plan    Sinusitis: History and exam is most consistent with bacterial sinus infection.  Etiology and prognosis discussed with patient.  Recommend antibiotics as ordered below.  Patient to complete course of antibiotics, use supportive medications like mucolytics and decongestants as needed.  May use Tylenol or Advil if needed.  Symptoms should improve over the next 2 weeks.  Patient will return or call if symptoms persist or worsen.  Follow up: Return if symptoms worsen or fail to improve.    Commons side effects, risks, benefits, and alternatives for medications and treatment plan prescribed today were discussed, and the patient expressed understanding of the given instructions. Patient  is instructed to call or message via MyChart if he/she has any questions or concerns regarding our treatment plan. No barriers to understanding were identified. We discussed Red Flag symptoms and signs in detail. Patient expressed understanding regarding what to do in case of urgent or emergency type symptoms.   Medication list was reconciled,  printed and provided to the patient in AVS. Patient instructions and summary information was reviewed with the patient as documented in the AVS. This note was prepared with assistance of Dragon voice recognition software. Occasional wrong-word or sound-a-like substitutions may have occurred due to the inherent limitations of voice recognition software  No orders of the defined types were placed in this encounter.  Meds ordered this encounter  Medications  . amoxicillin (AMOXIL) 875 MG tablet    Sig: Take 1 tablet (875 mg total) by mouth 2 (two) times daily for 7 days.    Dispense:  14 tablet    Refill:  0

## 2018-09-20 NOTE — Patient Instructions (Signed)
Please follow up if symptoms do not improve or as needed.   

## 2018-09-28 ENCOUNTER — Encounter: Payer: Self-pay | Admitting: Family Medicine

## 2018-09-28 MED ORDER — AMPHETAMINE-DEXTROAMPHETAMINE 10 MG PO TABS: 10.0000 mg | ORAL_TABLET | Freq: Three times a day (TID) | ORAL | 0 refills | Status: DC

## 2018-09-28 NOTE — Telephone Encounter (Signed)
Last OV 09/20/18 adderall last filled 08/30/18 #90 with 0

## 2018-10-25 ENCOUNTER — Encounter: Payer: Self-pay | Admitting: Family Medicine

## 2018-10-25 MED ORDER — AMPHETAMINE-DEXTROAMPHETAMINE 10 MG PO TABS: 10.0000 mg | ORAL_TABLET | Freq: Three times a day (TID) | ORAL | 0 refills | Status: DC

## 2018-10-25 NOTE — Telephone Encounter (Signed)
Last OV 09/20/18 adderall last filled 09/28/18 #90 with 0

## 2018-11-03 ENCOUNTER — Encounter: Payer: PRIVATE HEALTH INSURANCE | Admitting: Family Medicine

## 2018-11-29 ENCOUNTER — Encounter: Payer: Self-pay | Admitting: Family Medicine

## 2018-11-29 MED ORDER — AMPHETAMINE-DEXTROAMPHETAMINE 10 MG PO TABS: 10.0000 mg | ORAL_TABLET | Freq: Three times a day (TID) | ORAL | 0 refills | Status: DC

## 2018-11-29 NOTE — Telephone Encounter (Signed)
Last OV 09/20/18 adderall last filled 10/25/18 #90 with 0

## 2018-12-28 ENCOUNTER — Encounter: Payer: Self-pay | Admitting: Family Medicine

## 2018-12-29 NOTE — Addendum Note (Signed)
Addended by: Sheliah HatchABORI, Edwards Mckelvie E on: 12/29/2018 04:54 PM   Modules accepted: Orders

## 2018-12-30 MED ORDER — AMPHETAMINE-DEXTROAMPHET ER 30 MG PO CP24: 30.0000 mg | ORAL_CAPSULE | ORAL | 0 refills | Status: DC

## 2018-12-30 NOTE — Addendum Note (Signed)
Addended by: Sheliah Hatch on: 12/30/2018 12:18 PM   Modules accepted: Orders

## 2019-01-21 ENCOUNTER — Encounter: Payer: Self-pay | Admitting: Family Medicine

## 2019-01-24 MED ORDER — AMPHETAMINE-DEXTROAMPHETAMINE 10 MG PO TABS: 10.0000 mg | ORAL_TABLET | Freq: Three times a day (TID) | ORAL | 0 refills | Status: DC

## 2019-02-15 ENCOUNTER — Encounter: Payer: Self-pay | Admitting: Family Medicine

## 2019-02-15 NOTE — Telephone Encounter (Signed)
Last OV 09/20/18 adderall last filled 01/24/19 #90 with 0

## 2019-02-16 MED ORDER — AMPHETAMINE-DEXTROAMPHETAMINE 10 MG PO TABS: 10.0000 mg | ORAL_TABLET | Freq: Three times a day (TID) | ORAL | 0 refills | Status: DC

## 2019-03-17 ENCOUNTER — Encounter: Payer: Self-pay | Admitting: Family Medicine

## 2019-03-18 MED ORDER — AMPHETAMINE-DEXTROAMPHETAMINE 10 MG PO TABS: 10.0000 mg | ORAL_TABLET | Freq: Three times a day (TID) | ORAL | 0 refills | Status: DC

## 2019-03-18 NOTE — Telephone Encounter (Signed)
Last OV 09/20/18 adderall last filled 02/16/19 #90 with 0

## 2019-04-12 ENCOUNTER — Encounter: Payer: Self-pay | Admitting: Family Medicine

## 2019-04-12 MED ORDER — AMPHETAMINE-DEXTROAMPHETAMINE 10 MG PO TABS: 10.0000 mg | ORAL_TABLET | Freq: Three times a day (TID) | ORAL | 0 refills | Status: DC

## 2019-04-12 NOTE — Telephone Encounter (Signed)
Last OV 09/20/18 adderall last filled 03/18/19 #90 with 0

## 2019-05-02 ENCOUNTER — Encounter: Payer: Self-pay | Admitting: Family Medicine

## 2019-05-03 NOTE — Telephone Encounter (Signed)
Pt called after 4:00 LMOVM, returned called to pt LMOVM for CB to schedule a VV with KT

## 2019-05-04 ENCOUNTER — Ambulatory Visit (INDEPENDENT_AMBULATORY_CARE_PROVIDER_SITE_OTHER): Payer: Self-pay | Admitting: Family Medicine

## 2019-05-04 ENCOUNTER — Encounter: Payer: Self-pay | Admitting: Family Medicine

## 2019-05-04 ENCOUNTER — Other Ambulatory Visit: Payer: Self-pay

## 2019-05-04 VITALS — Ht 67.5 in | Wt 213.0 lb

## 2019-05-04 DIAGNOSIS — F902 Attention-deficit hyperactivity disorder, combined type: Secondary | ICD-10-CM

## 2019-05-04 MED ORDER — AMPHETAMINE-DEXTROAMPHETAMINE 12.5 MG PO TABS: 12.5000 mg | ORAL_TABLET | Freq: Three times a day (TID) | ORAL | 0 refills | Status: DC

## 2019-05-04 NOTE — Progress Notes (Signed)
   Virtual Visit via Video   I connected with patient on 05/04/19 at 10:00 AM EDT by a video enabled telemedicine application and verified that I am speaking with the correct person using two identifiers.  Location patient: Home Location provider: Acupuncturist, Office Persons participating in the virtual visit: Patient, Provider, [place] (Jess B)  I discussed the limitations of evaluation and management by telemedicine and the availability of in person appointments. The patient expressed understanding and agreed to proceed.  Subjective:   HPI:   ADHD- chronic problem.  On Adderall 10mg  TID.  Pt was having a hard time focusing when trying to work full time and home school.  Found that she needed more medication.  ROS:   See pertinent positives and negatives per HPI.  Patient Active Problem List   Diagnosis Date Noted  . Generalized anxiety disorder 05/15/2016  . Irregular menses 05/15/2016  . Weight gain 02/25/2016  . FH: colon cancer 02/25/2016  . Maxillary sinusitis, acute 01/07/2016  . Anemia 11/13/2015  . Encounter to establish care 10/25/2015  . Attention deficit hyperactivity disorder (ADHD) 10/25/2015  . Breast cancer screening 10/25/2015  . Encounter for preventive health examination 10/25/2015  . Obesity (BMI 30.0-34.9) 10/25/2015    Social History   Tobacco Use  . Smoking status: Never Smoker  . Smokeless tobacco: Never Used  Substance Use Topics  . Alcohol use: No    Current Outpatient Medications:  .  amphetamine-dextroamphetamine (ADDERALL) 10 MG tablet, Take 1 tablet (10 mg total) by mouth 3 (three) times daily., Disp: 90 tablet, Rfl: 0 .  cholecalciferol (VITAMIN D) 1000 units tablet, Take 1,000 Units by mouth daily., Disp: , Rfl:  .  citalopram (CELEXA) 20 MG tablet, Take 1 tablet (20 mg total) by mouth daily., Disp: 90 tablet, Rfl: 1 .  Cyanocobalamin (VITAMIN B 12 PO), Take by mouth., Disp: , Rfl:  .  ferrous sulfate (FERROUSUL) 325 (65 FE) MG  tablet, Take 1 tablet (325 mg total) by mouth 3 (three) times daily with meals. (Patient taking differently: Take 325 mg by mouth daily with breakfast. ), Disp: 90 tablet, Rfl: 1 .  fluticasone (FLONASE) 50 MCG/ACT nasal spray, Place 1 spray into both nostrils daily., Disp: 16 g, Rfl: 6  No Known Allergies  Objective:   Ht 5' 7.5" (1.715 m)   Wt 213 lb (96.6 kg)   BMI 32.87 kg/m   AAOx3, NAD NCAT, EOMI No obvious CN deficits Coloring WNL Pt is able to speak clearly, coherently without shortness of breath or increased work of breathing.  Thought process is linear.  Mood is appropriate.   Assessment and Plan:   ADHD- chronic problem.  Currently sub-optimally controlled w/ current work load and recent home schooling.  Will increase to 12.5mg  TID and monitor for improvement.  Pt expressed understanding and is in agreement w/ plan.    Annye Asa, MD 05/04/2019

## 2019-05-04 NOTE — Progress Notes (Signed)
I have discussed the procedure for the virtual visit with the patient who has given consent to proceed with assessment and treatment.   Kacper Cartlidge L Daud Cayer, CMA     

## 2019-05-05 MED ORDER — AMPHETAMINE-DEXTROAMPHETAMINE 12.5 MG PO TABS: 12.5000 mg | ORAL_TABLET | Freq: Three times a day (TID) | ORAL | 0 refills | Status: DC

## 2019-05-05 NOTE — Telephone Encounter (Signed)
Please advise. Medication pended.

## 2019-05-23 ENCOUNTER — Encounter: Payer: Self-pay | Admitting: Family Medicine

## 2019-05-30 ENCOUNTER — Encounter: Payer: Self-pay | Admitting: Family Medicine

## 2019-05-30 ENCOUNTER — Other Ambulatory Visit: Payer: Self-pay

## 2019-05-30 ENCOUNTER — Ambulatory Visit (INDEPENDENT_AMBULATORY_CARE_PROVIDER_SITE_OTHER): Payer: Self-pay | Admitting: Family Medicine

## 2019-05-30 DIAGNOSIS — F902 Attention-deficit hyperactivity disorder, combined type: Secondary | ICD-10-CM

## 2019-05-30 MED ORDER — AMPHETAMINE-DEXTROAMPHETAMINE 10 MG PO TABS: 10.0000 mg | ORAL_TABLET | Freq: Three times a day (TID) | ORAL | 0 refills | Status: DC

## 2019-05-30 NOTE — Progress Notes (Signed)
I have discussed the procedure for the virtual visit with the patient who has given consent to proceed with assessment and treatment.   Pt stopped medicine last week due to severe nausea, wanted to know if ok to return to previous medication.   Davis Gourd, CMA

## 2019-05-30 NOTE — Progress Notes (Signed)
   Virtual Visit via Video   I connected with patient on 05/30/19 at  2:00 PM EDT by a video enabled telemedicine application and verified that I am speaking with the correct person using two identifiers.  Location patient: Home Location provider: Acupuncturist, Office Persons participating in the virtual visit: Patient, Provider, [place] (Jess B)  I discussed the limitations of evaluation and management by telemedicine and the availability of in person appointments. The patient expressed understanding and agreed to proceed.  Subjective:   HPI:   ADHD- pt was increased to Adderall 12.5mg  TID at last visit.  'I didn't like the medicine.  It made me feel weird'.  Stopped med ~1 week ago and has felt better.  Pt prefers how she felt on 10mg  TID.  ROS:   See pertinent positives and negatives per HPI.  Patient Active Problem List   Diagnosis Date Noted  . Generalized anxiety disorder 05/15/2016  . Irregular menses 05/15/2016  . Weight gain 02/25/2016  . FH: colon cancer 02/25/2016  . Maxillary sinusitis, acute 01/07/2016  . Anemia 11/13/2015  . Encounter to establish care 10/25/2015  . Attention deficit hyperactivity disorder (ADHD) 10/25/2015  . Breast cancer screening 10/25/2015  . Encounter for preventive health examination 10/25/2015  . Obesity (BMI 30.0-34.9) 10/25/2015    Social History   Tobacco Use  . Smoking status: Never Smoker  . Smokeless tobacco: Never Used  Substance Use Topics  . Alcohol use: No    Current Outpatient Medications:  .  cholecalciferol (VITAMIN D) 1000 units tablet, Take 1,000 Units by mouth daily., Disp: , Rfl:  .  citalopram (CELEXA) 20 MG tablet, Take 1 tablet (20 mg total) by mouth daily., Disp: 90 tablet, Rfl: 1 .  Cyanocobalamin (VITAMIN B 12 PO), Take by mouth., Disp: , Rfl:  .  ferrous sulfate (FERROUSUL) 325 (65 FE) MG tablet, Take 1 tablet (325 mg total) by mouth 3 (three) times daily with meals. (Patient taking differently: Take  325 mg by mouth daily with breakfast. ), Disp: 90 tablet, Rfl: 1 .  fluticasone (FLONASE) 50 MCG/ACT nasal spray, Place 1 spray into both nostrils daily., Disp: 16 g, Rfl: 6 .  amphetamine-dextroamphetamine (ADDERALL) 12.5 MG tablet, Take 1 tablet by mouth 3 (three) times daily. (Patient not taking: Reported on 05/30/2019), Disp: 90 tablet, Rfl: 0  No Known Allergies  Objective:   There were no vitals taken for this visit. AAOx3, NAD NCAT, EOMI No obvious CN deficits Coloring WNL Pt is able to speak clearly, coherently without shortness of breath or increased work of breathing.  Thought process is linear.  Mood is appropriate.   Assessment and Plan:   ADHD- chronic problem.  Pt felt that she needed increased sx control at last visit but did not like the way she felt when taking the 12.5mg  TID.  Will decrease back to 10mg  TID at pt's request.  Pt expressed understanding and is in agreement w/ plan.    Annye Asa, MD 05/30/2019

## 2019-06-20 LAB — HM MAMMOGRAPHY

## 2019-06-24 ENCOUNTER — Encounter: Payer: Self-pay | Admitting: Family Medicine

## 2019-06-24 MED ORDER — AMPHETAMINE-DEXTROAMPHETAMINE 10 MG PO TABS: 10.0000 mg | ORAL_TABLET | Freq: Three times a day (TID) | ORAL | 0 refills | Status: DC

## 2019-06-24 NOTE — Telephone Encounter (Signed)
Last OV 05/30/19 adderall last filled 05/30/19 #90 with 0

## 2019-06-30 ENCOUNTER — Encounter: Payer: Self-pay | Admitting: Physician Assistant

## 2019-07-20 ENCOUNTER — Encounter: Payer: Self-pay | Admitting: Family Medicine

## 2019-07-20 MED ORDER — AMPHETAMINE-DEXTROAMPHETAMINE 10 MG PO TABS: 10.0000 mg | ORAL_TABLET | Freq: Three times a day (TID) | ORAL | 0 refills | Status: DC

## 2019-07-20 NOTE — Telephone Encounter (Signed)
Last OV 05/30/19 adderall last filled 06/24/19 #90 with 0

## 2019-08-02 ENCOUNTER — Encounter: Payer: Self-pay | Admitting: Family Medicine

## 2019-08-02 MED ORDER — CITALOPRAM HYDROBROMIDE 20 MG PO TABS: 20.0000 mg | ORAL_TABLET | Freq: Every day | ORAL | 1 refills | Status: DC

## 2019-08-02 NOTE — Telephone Encounter (Signed)
Ok to resend. Will be up to insurance if they will cover another fill though.

## 2019-08-16 ENCOUNTER — Encounter: Payer: Self-pay | Admitting: Family Medicine

## 2019-08-16 NOTE — Telephone Encounter (Signed)
Last OV 05/30/19 adderall last filled 07/20/19 #90 with 0

## 2019-08-17 MED ORDER — AMPHETAMINE-DEXTROAMPHETAMINE 10 MG PO TABS: 10.0000 mg | ORAL_TABLET | Freq: Three times a day (TID) | ORAL | 0 refills | Status: DC

## 2019-09-13 ENCOUNTER — Encounter: Payer: Self-pay | Admitting: Family Medicine

## 2019-09-16 ENCOUNTER — Ambulatory Visit: Payer: Self-pay | Admitting: Family Medicine

## 2019-09-26 ENCOUNTER — Encounter: Payer: Self-pay | Admitting: Family Medicine

## 2019-09-28 ENCOUNTER — Encounter: Payer: Self-pay | Admitting: Family Medicine

## 2019-09-28 MED ORDER — AMPHETAMINE-DEXTROAMPHETAMINE 10 MG PO TABS: 10.0000 mg | ORAL_TABLET | Freq: Three times a day (TID) | ORAL | 0 refills | Status: DC

## 2019-10-24 ENCOUNTER — Encounter: Payer: Self-pay | Admitting: Family Medicine

## 2019-10-24 NOTE — Telephone Encounter (Signed)
Last OV 05/30/19 adderall last filled 09/28/19 #90 with 0

## 2019-10-25 MED ORDER — AMPHETAMINE-DEXTROAMPHETAMINE 10 MG PO TABS: 10.0000 mg | ORAL_TABLET | Freq: Three times a day (TID) | ORAL | 0 refills | Status: DC

## 2019-11-21 ENCOUNTER — Encounter: Payer: Self-pay | Admitting: Family Medicine

## 2019-11-21 MED ORDER — AMPHETAMINE-DEXTROAMPHETAMINE 10 MG PO TABS: 10.0000 mg | ORAL_TABLET | Freq: Three times a day (TID) | ORAL | 0 refills | Status: DC

## 2019-11-21 NOTE — Telephone Encounter (Signed)
Last OV 05/30/19 adderall last filled 10/25/19 #90 with 0

## 2019-12-16 ENCOUNTER — Other Ambulatory Visit: Payer: Self-pay | Admitting: Family Medicine

## 2019-12-16 MED ORDER — AMPHETAMINE-DEXTROAMPHETAMINE 10 MG PO TABS: 10.0000 mg | ORAL_TABLET | Freq: Three times a day (TID) | ORAL | 0 refills | Status: DC

## 2019-12-16 NOTE — Telephone Encounter (Signed)
Last OV 05/30/19 adderall last filled 11/21/19 #90 with 0

## 2020-01-16 ENCOUNTER — Other Ambulatory Visit: Payer: Self-pay | Admitting: Family Medicine

## 2020-01-17 MED ORDER — AMPHETAMINE-DEXTROAMPHETAMINE 10 MG PO TABS: 10.0000 mg | ORAL_TABLET | Freq: Three times a day (TID) | ORAL | 0 refills | Status: DC

## 2020-01-17 NOTE — Telephone Encounter (Signed)
Last OV 05/30/19 adderall last filled 12/16/19 #90 with 0

## 2020-02-09 ENCOUNTER — Encounter: Payer: Self-pay | Admitting: Family Medicine

## 2020-02-09 NOTE — Telephone Encounter (Signed)
Last OV 05/30/19 adderall last filled 01/17/20 #90 with 0

## 2020-02-10 MED ORDER — AMPHETAMINE-DEXTROAMPHETAMINE 10 MG PO TABS: 10.0000 mg | ORAL_TABLET | Freq: Three times a day (TID) | ORAL | 0 refills | Status: DC

## 2020-02-17 ENCOUNTER — Other Ambulatory Visit: Payer: Self-pay | Admitting: Family Medicine

## 2020-03-15 ENCOUNTER — Other Ambulatory Visit: Payer: Self-pay | Admitting: Family Medicine

## 2020-03-15 MED ORDER — AMPHETAMINE-DEXTROAMPHETAMINE 10 MG PO TABS: 10.0000 mg | ORAL_TABLET | Freq: Three times a day (TID) | ORAL | 0 refills | Status: DC

## 2020-03-15 NOTE — Telephone Encounter (Signed)
I believe I sent this to you this morning.

## 2020-03-15 NOTE — Telephone Encounter (Signed)
Last OV 05/30/19 adderall last filled 02/10/20 #90 with 0

## 2020-04-07 ENCOUNTER — Other Ambulatory Visit: Payer: Self-pay | Admitting: Family Medicine

## 2020-04-09 MED ORDER — AMPHETAMINE-DEXTROAMPHETAMINE 10 MG PO TABS: 10.0000 mg | ORAL_TABLET | Freq: Three times a day (TID) | ORAL | 0 refills | Status: DC

## 2020-04-09 NOTE — Telephone Encounter (Signed)
Last OV 05/30/19 Adderall last filled 03/15/20 #90 with 0

## 2020-04-19 ENCOUNTER — Telehealth (INDEPENDENT_AMBULATORY_CARE_PROVIDER_SITE_OTHER): Payer: Self-pay | Admitting: Family Medicine

## 2020-04-19 ENCOUNTER — Other Ambulatory Visit: Payer: Self-pay

## 2020-04-19 ENCOUNTER — Encounter: Payer: Self-pay | Admitting: Family Medicine

## 2020-04-19 VITALS — Ht 67.5 in | Wt 209.0 lb

## 2020-04-19 DIAGNOSIS — J0100 Acute maxillary sinusitis, unspecified: Secondary | ICD-10-CM

## 2020-04-19 DIAGNOSIS — R42 Dizziness and giddiness: Secondary | ICD-10-CM

## 2020-04-19 MED ORDER — AMOXICILLIN 875 MG PO TABS: 875.0000 mg | ORAL_TABLET | Freq: Two times a day (BID) | ORAL | 0 refills | Status: DC

## 2020-04-19 MED ORDER — MECLIZINE HCL 25 MG PO TABS: 25.0000 mg | ORAL_TABLET | Freq: Three times a day (TID) | ORAL | 0 refills | Status: DC | PRN

## 2020-04-19 NOTE — Progress Notes (Signed)
I have discussed the procedure for the virtual visit with the patient who has given consent to proceed with assessment and treatment.   Pt unable to obtain vitals.   Jessica L Brodmerkel, CMA     

## 2020-04-19 NOTE — Progress Notes (Signed)
   Virtual Visit via Video   I connected with patient on 04/19/20 at  2:30 PM EDT by a video enabled telemedicine application and verified that I am speaking with the correct person using two identifiers.  Location patient: Home Location provider: Astronomer, Office Persons participating in the virtual visit: Patient, Provider, CMA (Jess B)  I discussed the limitations of evaluation and management by telemedicine and the availability of in person appointments. The patient expressed understanding and agreed to proceed.  Subjective:   HPI:   'i'm pretty sure I got a sinus infection'- + dizziness, vertigo started Tuesday- worse w/ looking in certain directions.  Last week had increased sneezing, nasal congestion.  + tooth pain, facial pressure, maxillayr sinus pain.  'I can feel fluid between my ears'.  No fevers.  'my L ear feels packed'.  No known sick contacts.  No changes to taste/smell.  Feels similar to previous infxns.  Denies N/V/D.    ROS:   See pertinent positives and negatives per HPI.  Patient Active Problem List   Diagnosis Date Noted  . Generalized anxiety disorder 05/15/2016  . Irregular menses 05/15/2016  . Weight gain 02/25/2016  . FH: colon cancer 02/25/2016  . Maxillary sinusitis, acute 01/07/2016  . Anemia 11/13/2015  . Encounter to establish care 10/25/2015  . Attention deficit hyperactivity disorder (ADHD) 10/25/2015  . Breast cancer screening 10/25/2015  . Encounter for preventive health examination 10/25/2015  . Obesity (BMI 30.0-34.9) 10/25/2015    Social History   Tobacco Use  . Smoking status: Never Smoker  . Smokeless tobacco: Never Used  Substance Use Topics  . Alcohol use: No    Current Outpatient Medications:  .  amphetamine-dextroamphetamine (ADDERALL) 10 MG tablet, Take 1 tablet (10 mg total) by mouth 3 (three) times daily., Disp: 90 tablet, Rfl: 0 .  cholecalciferol (VITAMIN D) 1000 units tablet, Take 1,000 Units by mouth daily.,  Disp: , Rfl:  .  citalopram (CELEXA) 20 MG tablet, TAKE 1 TABLET BY MOUTH EVERY DAY, Disp: 90 tablet, Rfl: 1 .  Cyanocobalamin (VITAMIN B 12 PO), Take by mouth., Disp: , Rfl:  .  ferrous sulfate (FERROUSUL) 325 (65 FE) MG tablet, Take 1 tablet (325 mg total) by mouth 3 (three) times daily with meals. (Patient taking differently: Take 325 mg by mouth daily with breakfast. ), Disp: 90 tablet, Rfl: 1 .  fluticasone (FLONASE) 50 MCG/ACT nasal spray, Place 1 spray into both nostrils daily., Disp: 16 g, Rfl: 6  No Known Allergies  Objective:   Ht 5' 7.5" (1.715 m)   Wt 209 lb (94.8 kg)   BMI 32.25 kg/m  AAOx3, NAD NCAT, EOMI No obvious CN deficits Coloring WNL Pt is able to speak clearly, coherently without shortness of breath or increased work of breathing.  Thought process is linear.  Mood is appropriate.  Assessment and Plan:   Maxillary sinusitis- new.  sxs started last week and have worsened.  Now w/ vertigo, facial pain, tooth pain, and ear pressure.  Start Amoxicillin to treat sinus infxn and possible ear pain.  Reviewed supportive care and red flags that should prompt return.  Pt expressed understanding and is in agreement w/ plan.   Vertigo- likely due to ear pressure and congestion.  Start Meclizine prn.  Reviewed supportive care and red flags that should prompt return.  Pt expressed understanding and is in agreement w/ plan.   Neena Rhymes, MD 04/19/2020

## 2020-04-30 ENCOUNTER — Encounter: Payer: Self-pay | Admitting: Family Medicine

## 2020-05-03 ENCOUNTER — Encounter: Payer: Self-pay | Admitting: Family Medicine

## 2020-05-03 ENCOUNTER — Other Ambulatory Visit: Payer: Self-pay

## 2020-05-03 ENCOUNTER — Telehealth (INDEPENDENT_AMBULATORY_CARE_PROVIDER_SITE_OTHER): Payer: Self-pay | Admitting: Family Medicine

## 2020-05-03 DIAGNOSIS — F411 Generalized anxiety disorder: Secondary | ICD-10-CM

## 2020-05-03 DIAGNOSIS — F902 Attention-deficit hyperactivity disorder, combined type: Secondary | ICD-10-CM

## 2020-05-03 MED ORDER — AMPHETAMINE-DEXTROAMPHETAMINE 15 MG PO TABS: 15.0000 mg | ORAL_TABLET | Freq: Three times a day (TID) | ORAL | 0 refills | Status: DC
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
## 2020-05-03 NOTE — Progress Notes (Signed)
I have discussed the procedure for the virtual visit with the patient who has given consent to proceed with assessment and treatment.   Pt unable to obtain vitals.   Ronasia Isola L Jermeka Schlotterbeck, CMA     

## 2020-05-26 ENCOUNTER — Other Ambulatory Visit: Payer: Self-pay | Admitting: Family Medicine

## 2020-05-29 MED ORDER — AMPHETAMINE-DEXTROAMPHETAMINE 15 MG PO TABS: 15.0000 mg | ORAL_TABLET | Freq: Three times a day (TID) | ORAL | 0 refills | Status: DC

## 2020-05-29 NOTE — Telephone Encounter (Signed)
adderall LFD 05/03/20 #90 with no refills LOV 05/03/20 NOV none

## 2020-06-06 ENCOUNTER — Other Ambulatory Visit: Payer: Self-pay | Admitting: Family Medicine

## 2020-06-26 ENCOUNTER — Other Ambulatory Visit: Payer: Self-pay | Admitting: Physician Assistant

## 2020-06-27 MED ORDER — AMPHETAMINE-DEXTROAMPHETAMINE 15 MG PO TABS: 15.0000 mg | ORAL_TABLET | Freq: Three times a day (TID) | ORAL | 0 refills | Status: DC

## 2020-06-27 NOTE — Telephone Encounter (Signed)
Last OV 05/03/20 Adderall last filled 05/29/20 #90 with 0

## 2020-09-19 ENCOUNTER — Other Ambulatory Visit: Payer: Self-pay | Admitting: Family Medicine

## 2020-09-19 MED ORDER — AMPHETAMINE-DEXTROAMPHETAMINE 15 MG PO TABS: 15.0000 mg | ORAL_TABLET | Freq: Three times a day (TID) | ORAL | 0 refills | Status: DC

## 2020-09-19 NOTE — Telephone Encounter (Signed)
Last OV 05/03/20 Adderall last filled 06/27/20 #90 with 0

## 2020-09-28 ENCOUNTER — Telehealth (INDEPENDENT_AMBULATORY_CARE_PROVIDER_SITE_OTHER): Payer: BC Managed Care – PPO | Admitting: Family Medicine

## 2020-09-28 ENCOUNTER — Encounter: Payer: Self-pay | Admitting: Family Medicine

## 2020-09-28 DIAGNOSIS — R5081 Fever presenting with conditions classified elsewhere: Secondary | ICD-10-CM

## 2020-09-28 DIAGNOSIS — J019 Acute sinusitis, unspecified: Secondary | ICD-10-CM | POA: Diagnosis not present

## 2020-09-28 DIAGNOSIS — J111 Influenza due to unidentified influenza virus with other respiratory manifestations: Secondary | ICD-10-CM

## 2020-09-28 MED ORDER — AZITHROMYCIN 250 MG PO TABS: ORAL_TABLET | ORAL | 0 refills | Status: DC

## 2020-09-28 NOTE — Progress Notes (Signed)
Virtual Visit via Video Note  I connected with pt on 09/28/20 at 10:00 AM EDT by a video enabled telemedicine application and verified that I am speaking with the correct person using two identifiers.  Location patient: home Location provider:work or home office Persons participating in the virtual visit: patient, provider  I discussed the limitations of evaluation and management by telemedicine and the availability of in person appointments. The patient expressed understanding and agreed to proceed.  Telemedicine visit is a necessity given the COVID-19 restrictions in place at the current time.  HPI: 44 y/o WF being seen today for cough. Onset about 2 wks ago with nasal cong "allergies/sinus pressure", lots of PND. Acute worsening last 24h.  NO SOB, CP, or wheezing.  Some HA but no ST. Lots of sinus pressure, generalized weakness, subj fever, some cough. Taste and smell intact. Taking some nyquil and dayquil and steroid nasal spray.  Reports hx of covid approx 01/2019. No covid vaccine or flu vaccine.    Past Medical History:  Diagnosis Date  . ADHD (attention deficit hyperactivity disorder)   . Allergy   . Anxiety   . Depression   . Fracture, toe   . Iron deficiency anemia   . Meningitis spinal    bacterial-child  . Migraine     Past Surgical History:  Procedure Laterality Date  . BREAST SURGERY  2002   implants (saline)     Current Outpatient Medications:  .  amphetamine-dextroamphetamine (ADDERALL) 15 MG tablet, Take 1 tablet by mouth 3 (three) times daily., Disp: 90 tablet, Rfl: 0 .  cholecalciferol (VITAMIN D) 1000 units tablet, Take 1,000 Units by mouth daily., Disp: , Rfl:  .  citalopram (CELEXA) 20 MG tablet, TAKE 1 TABLET BY MOUTH EVERY DAY, Disp: 90 tablet, Rfl: 1 .  Cyanocobalamin (VITAMIN B 12 PO), Take by mouth., Disp: , Rfl:  .  ferrous sulfate (FERROUSUL) 325 (65 FE) MG tablet, Take 1 tablet (325 mg total) by mouth 3 (three) times daily with meals.  (Patient taking differently: Take 325 mg by mouth daily with breakfast. ), Disp: 90 tablet, Rfl: 1 .  fluticasone (FLONASE) 50 MCG/ACT nasal spray, Place 1 spray into both nostrils daily., Disp: 16 g, Rfl: 6 .  azithromycin (ZITHROMAX) 250 MG tablet, 2 tabs po qd x 1d, then 1 tab po qd x 4d, Disp: 6 tablet, Rfl: 0 .  meclizine (ANTIVERT) 25 MG tablet, Take 1 tablet (25 mg total) by mouth 3 (three) times daily as needed for dizziness. (Patient not taking: Reported on 09/28/2020), Disp: 45 tablet, Rfl: 0  EXAM:  VITALS per patient if applicable:  Vitals with BMI 04/19/2020 05/04/2019 09/20/2018  Height 5' 7.5" 5' 7.5" 5\' 8"   Weight 209 lbs 213 lbs 220 lbs  BMI 32.23 32.85 33.46  Systolic - - 114  Diastolic - - 78  Pulse - - 96     GENERAL: alert, oriented, appears well and in no acute distress  HEENT: atraumatic, conjunttiva clear, no obvious abnormalities on inspection of external nose and ears  NECK: normal movements of the head and neck  LUNGS: on inspection no signs of respiratory distress, breathing rate appears normal, no obvious gross SOB, gasping or wheezing  CV: no obvious cyanosis  MS: moves all visible extremities without noticeable abnormality  PSYCH/NEURO: pleasant and cooperative, no obvious depression or anxiety, speech and thought processing grossly intact  ASSESSMENT AND PLAN:  Discussed the following assessment and plan:  Acute febrile URI with cough and mild/mod  systemic sx's (malaise, fatigue). Nasal allergy sx's leading up to this acute illness. Treat with Zpack for possible acute bact sinusitis, but suspect viral etiology of her illness. Discussed flu and covid swabs, says she cannot come in today for this so we'll arrange for testing here on afternoon of 10/01/20 but if feeling improved at that time can d/c this. OTC symptomatic care discussed, push fluids, quarantine, rest, etc.  -we discussed possible serious and likely etiologies, options for evaluation  and workup, limitations of telemedicine visit vs in person visit, treatment, treatment risks and precautions. Pt prefers to treat via telemedicine empirically rather than in person at this moment.    I discussed the assessment and treatment plan with the patient. The patient was provided an opportunity to ask questions and all were answered. The patient agreed with the plan and demonstrated an understanding of the instructions.    F/u: if not improving signif in 3-4d  Signed:  Santiago Bumpers, MD           09/28/2020

## 2020-10-01 ENCOUNTER — Ambulatory Visit: Payer: BC Managed Care – PPO

## 2020-10-01 ENCOUNTER — Other Ambulatory Visit: Payer: Self-pay

## 2020-10-01 DIAGNOSIS — J019 Acute sinusitis, unspecified: Secondary | ICD-10-CM

## 2020-10-05 ENCOUNTER — Telehealth: Payer: Self-pay | Admitting: Family Medicine

## 2020-10-05 ENCOUNTER — Emergency Department (HOSPITAL_BASED_OUTPATIENT_CLINIC_OR_DEPARTMENT_OTHER): Payer: BC Managed Care – PPO

## 2020-10-05 ENCOUNTER — Other Ambulatory Visit: Payer: Self-pay

## 2020-10-05 ENCOUNTER — Telehealth (INDEPENDENT_AMBULATORY_CARE_PROVIDER_SITE_OTHER): Payer: BC Managed Care – PPO | Admitting: Family Medicine

## 2020-10-05 ENCOUNTER — Encounter: Payer: Self-pay | Admitting: Family Medicine

## 2020-10-05 ENCOUNTER — Encounter (HOSPITAL_BASED_OUTPATIENT_CLINIC_OR_DEPARTMENT_OTHER): Payer: Self-pay | Admitting: Emergency Medicine

## 2020-10-05 ENCOUNTER — Emergency Department (HOSPITAL_BASED_OUTPATIENT_CLINIC_OR_DEPARTMENT_OTHER)
Admission: EM | Admit: 2020-10-05 | Discharge: 2020-10-05 | Disposition: A | Discharge status: Home / Self Care | Payer: BC Managed Care – PPO | Attending: Emergency Medicine | Admitting: Emergency Medicine

## 2020-10-05 DIAGNOSIS — R7989 Other specified abnormal findings of blood chemistry: Secondary | ICD-10-CM | POA: Diagnosis not present

## 2020-10-05 DIAGNOSIS — J1282 Pneumonia due to coronavirus disease 2019: Secondary | ICD-10-CM | POA: Diagnosis not present

## 2020-10-05 DIAGNOSIS — Z20822 Contact with and (suspected) exposure to covid-19: Secondary | ICD-10-CM | POA: Diagnosis not present

## 2020-10-05 DIAGNOSIS — R0602 Shortness of breath: Secondary | ICD-10-CM

## 2020-10-05 DIAGNOSIS — U071 COVID-19: Secondary | ICD-10-CM | POA: Diagnosis not present

## 2020-10-05 DIAGNOSIS — J189 Pneumonia, unspecified organism: Secondary | ICD-10-CM | POA: Diagnosis not present

## 2020-10-05 DIAGNOSIS — J90 Pleural effusion, not elsewhere classified: Secondary | ICD-10-CM | POA: Diagnosis not present

## 2020-10-05 LAB — CBC WITH DIFFERENTIAL/PLATELET
Abs Immature Granulocytes: 0.03 10*3/uL (ref 0.00–0.07)
Basophils Absolute: 0 10*3/uL (ref 0.0–0.1)
Basophils Relative: 0 %
Eosinophils Absolute: 0 10*3/uL (ref 0.0–0.5)
Eosinophils Relative: 0 %
HCT: 43.8 % (ref 36.0–46.0)
Hemoglobin: 14.1 g/dL (ref 12.0–15.0)
Immature Granulocytes: 1 %
Lymphocytes Relative: 23 %
Lymphs Abs: 1.4 10*3/uL (ref 0.7–4.0)
MCH: 27.2 pg (ref 26.0–34.0)
MCHC: 32.2 g/dL (ref 30.0–36.0)
MCV: 84.6 fL (ref 80.0–100.0)
Monocytes Absolute: 0.4 10*3/uL (ref 0.1–1.0)
Monocytes Relative: 7 %
Neutro Abs: 4.2 10*3/uL (ref 1.7–7.7)
Neutrophils Relative %: 69 %
Platelets: 224 10*3/uL (ref 150–400)
RBC: 5.18 MIL/uL — ABNORMAL HIGH (ref 3.87–5.11)
RDW: 14.6 % (ref 11.5–15.5)
WBC: 6 10*3/uL (ref 4.0–10.5)
nRBC: 0 % (ref 0.0–0.2)

## 2020-10-05 LAB — COMPREHENSIVE METABOLIC PANEL
ALT: 16 U/L (ref 0–44)
AST: 19 U/L (ref 15–41)
Albumin: 4.2 g/dL (ref 3.5–5.0)
Alkaline Phosphatase: 57 U/L (ref 38–126)
Anion gap: 12 (ref 5–15)
BUN: 9 mg/dL (ref 6–20)
CO2: 26 mmol/L (ref 22–32)
Calcium: 9 mg/dL (ref 8.9–10.3)
Chloride: 97 mmol/L — ABNORMAL LOW (ref 98–111)
Creatinine, Ser: 0.7 mg/dL (ref 0.44–1.00)
GFR, Estimated: >60 mL/min (ref >60)
Glucose, Bld: 112 mg/dL — ABNORMAL HIGH (ref 70–99)
Potassium: 4.3 mmol/L (ref 3.5–5.1)
Sodium: 135 mmol/L (ref 135–145)
Total Bilirubin: 0.4 mg/dL (ref 0.3–1.2)
Total Protein: 8 g/dL (ref 6.5–8.1)

## 2020-10-05 LAB — RESPIRATORY PANEL BY RT PCR (FLU A&B, COVID)
Influenza A by PCR: NEGATIVE
Influenza B by PCR: NEGATIVE
SARS Coronavirus 2 by RT PCR: POSITIVE — AB

## 2020-10-05 LAB — D-DIMER, QUANTITATIVE: D-Dimer, Quant: 0.53 ug/mL-FEU — ABNORMAL HIGH (ref 0.00–0.50)

## 2020-10-05 LAB — TROPONIN I (HIGH SENSITIVITY): Troponin I (High Sensitivity): 2 ng/L (ref <18)

## 2020-10-05 MED ORDER — ACETAMINOPHEN 500 MG PO TABS
1000.0000 mg | ORAL_TABLET | Freq: Once | ORAL | Status: AC
  Administered 2020-10-05: 1000 mg via ORAL
  Filled 2020-10-05: qty 2

## 2020-10-05 MED ORDER — ONDANSETRON 4 MG PO TBDP: 4.0000 mg | ORAL_TABLET | Freq: Three times a day (TID) | ORAL | 0 refills | Status: DC | PRN

## 2020-10-05 MED ORDER — SODIUM CHLORIDE 0.9 % IV BOLUS
1000.0000 mL | Freq: Once | INTRAVENOUS | Status: AC
  Administered 2020-10-05: 1000 mL via INTRAVENOUS

## 2020-10-05 MED ORDER — ALBUTEROL SULFATE HFA 108 (90 BASE) MCG/ACT IN AERS
1.0000 | INHALATION_SPRAY | Freq: Four times a day (QID) | RESPIRATORY_TRACT | 0 refills | Status: DC | PRN
Start: 2020-10-05 — End: 2020-10-23

## 2020-10-05 MED ORDER — BENZONATATE 100 MG PO CAPS: 100.0000 mg | ORAL_CAPSULE | Freq: Three times a day (TID) | ORAL | 0 refills | Status: DC

## 2020-10-05 MED ORDER — IOHEXOL 350 MG/ML SOLN
100.0000 mL | Freq: Once | INTRAVENOUS | Status: AC | PRN
  Administered 2020-10-05: 100 mL via INTRAVENOUS

## 2020-10-05 MED ORDER — DEXAMETHASONE SODIUM PHOSPHATE 10 MG/ML IJ SOLN
8.0000 mg | Freq: Once | INTRAMUSCULAR | Status: AC
  Administered 2020-10-05: 8 mg via INTRAVENOUS
  Filled 2020-10-05: qty 1

## 2020-10-05 MED ORDER — DEXAMETHASONE 6 MG PO TABS: 6.0000 mg | ORAL_TABLET | Freq: Every day | ORAL | 0 refills | Status: AC

## 2020-10-05 NOTE — Telephone Encounter (Signed)
Unfortunately home covid tests are not very reliable and given her sxs, we need to keep the appt as virtual

## 2020-10-05 NOTE — Telephone Encounter (Signed)
Pt is aware.  

## 2020-10-05 NOTE — ED Notes (Signed)
Ambulated pt from fast track 2 to room 8.  Pt sats are 97% but HR up to 130's.  PA notified

## 2020-10-05 NOTE — ED Provider Notes (Signed)
[facility] EMERGENCY DEPARTMENT Provider Note   CSN: [id number] Arrival date & time: 10/05/20  1453     History Chief Complaint  Patient presents with  . Shortness of Breath    + covid    Jenny Little is a 44 y.o. female  COVID exposure at work, Pt is unvaccinated Cough, malaise 9 days ago PCP 1 week ago, got Azithromycin, completed without resolve of symptoms Coughing up light yellow phlegm Generalized weakness, malaise, myalgias. Fever 5 days ago, has not taken temp since SOB with minimal movement, Chest tightness with coughing No headache, abdominal pain, diarrhea, unilateral weakness, paresthesias. Additional PCP televisit today with concerns for shortness of breath sent here for evaluation for possible Covid versus PE. Jenny Little denies any unilateral leg swelling, redness or warmth.  No prior history of PE or DVT.  No recent surgery, immobilization, exogenous hormone use.  Denies additional aggravating or alleviating factors.  History obtained from patient and past medical records.  No interpreter used.   HPI     Past Medical History:  Diagnosis Date  . ADHD (attention deficit hyperactivity disorder)   . Allergy   . Anxiety   . Depression   . Fracture, toe   . Iron deficiency anemia   . Meningitis spinal    bacterial-child  . Migraine     Patient Active Problem List   Diagnosis Date Noted  . Generalized anxiety disorder 05/15/2016  . Irregular menses 05/15/2016  . FH: colon cancer 02/25/2016  . Anemia 11/13/2015  . Encounter to establish care 10/25/2015  . Attention deficit hyperactivity disorder (ADHD) 10/25/2015  . Breast cancer screening 10/25/2015  . Encounter for preventive health examination 10/25/2015  . Obesity (BMI 30.0-34.9) 10/25/2015    Past Surgical History:  Procedure Laterality Date  . BREAST SURGERY  2002   implants (saline)     OB History    Gravida  3   Para  2   Term  2   Preterm      AB  1   Living  2      SAB  1   TAB      Ectopic      Multiple      Live Births              Family History  Problem Relation Age of Onset  . Heart disease Father   . Diabetes Father   . Mental illness Father        depression  . AAA (abdominal aortic aneurysm) Father 41       died   . Hypertension Brother   . Colon polyps Mother        x many, non-cancerous  . Colon cancer Maternal Grandmother 40       died of colon cancer  . Early death Maternal Grandmother   . Colon cancer Paternal Grandmother 54       metz to bone  . Early death Paternal Uncle   . Heart disease Paternal Uncle   . Heart disease Maternal Grandfather   . Stroke Paternal Grandfather     Social History   Tobacco Use  . Smoking status: Never Smoker  . Smokeless tobacco: Never Used  Vaping Use  . Vaping Use: Never used  Substance Use Topics  . Alcohol use: No  . Drug use: No    Home Medications Prior to Admission medications   Medication Sig Start Date End Date Taking? Authorizing Provider  albuterol (VENTOLIN HFA) 108 (  90 Base) MCG/ACT inhaler Inhale 1-2 puffs into the lungs every 6 (six) hours as needed for wheezing or shortness of breath. 10/05/20   Alydia Gosser A, PA-C  amphetamine-dextroamphetamine (ADDERALL) 15 MG tablet Take 1 tablet by mouth 3 (three) times daily. 09/19/20   Sheliah Hatch, MD  azithromycin (ZITHROMAX) 250 MG tablet 2 tabs po qd x 1d, then 1 tab po qd x 4d Patient not taking: Reported on 10/05/2020 09/28/20   Jeoffrey Massed, MD  benzonatate (TESSALON) 100 MG capsule Take 1 capsule (100 mg total) by mouth every 8 (eight) hours. 10/05/20   Charlestine Rookstool A, PA-C  cholecalciferol (VITAMIN D) 1000 units tablet Take 1,000 Units by mouth daily.    [provider]  citalopram (CELEXA) 20 MG tablet TAKE 1 TABLET BY MOUTH EVERY DAY 06/07/20   Sheliah Hatch, MD  Cyanocobalamin (VITAMIN B 12 PO) Take by mouth.    [provider]  dexamethasone (DECADRON) 6 MG  tablet Take 1 tablet (6 mg total) by mouth daily for 5 days. 10/05/20 10/10/20  Ligaya Cormier A, PA-C  ferrous sulfate (FERROUSUL) 325 (65 FE) MG tablet Take 1 tablet (325 mg total) by mouth 3 (three) times daily with meals. Patient not taking: Reported on 10/05/2020 11/14/15   Felix Pacini A, DO  fluticasone (FLONASE) 50 MCG/ACT nasal spray Place 1 spray into both nostrils daily. 01/19/18   Willow Ora, MD  meclizine (ANTIVERT) 25 MG tablet Take 1 tablet (25 mg total) by mouth 3 (three) times daily as needed for dizziness. Patient not taking: Reported on 09/28/2020 04/19/20   Sheliah Hatch, MD  ondansetron (ZOFRAN ODT) 4 MG disintegrating tablet Take 1 tablet (4 mg total) by mouth every 8 (eight) hours as needed for nausea or vomiting. 10/05/20   Hanley Woerner A, PA-C    Allergies    Patient has no known allergies.  Review of Systems   Review of Systems  Constitutional: Positive for activity change, appetite change, chills, fatigue and fever.  HENT: Positive for congestion and rhinorrhea.   Respiratory: Positive for cough and shortness of breath. Negative for apnea, choking, chest tightness, wheezing and stridor.   Cardiovascular: Positive for chest pain (Tightness).  Gastrointestinal: Negative.   Genitourinary: Negative.   Musculoskeletal: Negative.   Skin: Negative.   Neurological: Positive for weakness (Generalized). Negative for dizziness, tremors, seizures, syncope, facial asymmetry, speech difficulty, light-headedness, numbness and headaches.  All other systems reviewed and are negative.  Physical Exam Updated Vital Signs BP 116/75   Pulse 93   Temp 99.6 F (37.6 C) (Oral)   Resp 19   Ht 5' 7.5" (1.715 m)   Wt 92.1 kg   SpO2 97%   BMI 31.33 kg/m   Physical Exam Vitals and nursing note reviewed.  Constitutional:      General: Jenny Little is not in acute distress.    Appearance: Jenny Little is well-developed. Jenny Little is not ill-appearing, toxic-appearing or diaphoretic.    HENT:     Head: Normocephalic and atraumatic.     Jaw: There is normal jaw occlusion.     Right Ear: Tympanic membrane, ear canal and external ear normal. There is no impacted cerumen. No hemotympanum. Tympanic membrane is not injected, scarred, perforated, erythematous, retracted or bulging.     Left Ear: Tympanic membrane, ear canal and external ear normal. There is no impacted cerumen. No hemotympanum. Tympanic membrane is not injected, scarred, perforated, erythematous, retracted or bulging.     Ears:  Comments: No Mastoid tenderness.    Nose:     Comments: Clear rhinorrhea and congestion to bilateral nares.  No sinus tenderness.    Mouth/Throat:     Mouth: Mucous membranes are moist.     Comments: Posterior oropharynx clear.  Mucous membranes moist.  Tonsils without erythema or exudate.  Uvula midline without deviation.  No evidence of PTA or RPA.  No drooling, dysphasia or trismus.  Phonation normal. Eyes:     Pupils: Pupils are equal, round, and reactive to light.  Neck:     Trachea: Trachea and phonation normal.     Meningeal: Brudzinski's sign and Kernig's sign absent.     Comments: No Neck stiffness or neck rigidity.  No meningismus.  No cervical lymphadenopathy. Cardiovascular:     Rate and Rhythm: Tachycardia present.     Comments: No murmurs rubs or gallops. Pulmonary:     Effort: Pulmonary effort is normal. No respiratory distress.     Breath sounds: Normal breath sounds.     Comments: Speaks in short sentences.  Wet cough on exam Abdominal:     General: Bowel sounds are normal. There is no distension.     Palpations: Abdomen is soft.     Comments: Soft, nontender without rebound or guarding.  No CVA tenderness.  Musculoskeletal:        General: Normal range of motion.     Cervical back: Normal range of motion.     Right lower leg: No tenderness. No edema.     Left lower leg: No tenderness. No edema.     Comments: Moves all 4 extremities without difficulty.  Lower  extremities without edema, erythema or warmth.  Skin:    General: Skin is warm and dry.     Capillary Refill: Capillary refill takes less than 2 seconds.     Comments: Brisk capillary refill.  No rashes or lesions.  Neurological:     General: No focal deficit present.     Mental Status: Jenny Little is alert.     Comments: Ambulatory in department without difficulty.  Cranial nerves II through XII grossly intact.  No facial droop.  No aphasia.     ED Results / Procedures / Treatments   Labs (all labs ordered are listed, but only abnormal results are displayed) Labs Reviewed  RESPIRATORY PANEL BY RT PCR (FLU A&B, COVID) - Abnormal; Notable for the following components:      Result Value   SARS Coronavirus 2 by RT PCR POSITIVE (*)    All other components within normal limits  CBC WITH DIFFERENTIAL/PLATELET - Abnormal; Notable for the following components:   RBC 5.18 (*)    All other components within normal limits  COMPREHENSIVE METABOLIC PANEL - Abnormal; Notable for the following components:   Chloride 97 (*)    Glucose, Bld 112 (*)    All other components within normal limits  D-DIMER, QUANTITATIVE (NOT AT [facility]) - Abnormal; Notable for the following components:   D-Dimer, Quant 0.53 (*)    All other components within normal limits  PREGNANCY, URINE  URINALYSIS, ROUTINE W REFLEX MICROSCOPIC  TROPONIN I (HIGH SENSITIVITY)  TROPONIN I (HIGH SENSITIVITY)    EKG EKG Interpretation  Date/Time:  Friday October 05 2020 16:55:23 EST Ventricular Rate:  100 PR Interval:    QRS Duration: 84 QT Interval:  322 QTC Calculation: 416 R Axis:   41 Text Interpretation: Sinus tachycardia Low voltage, precordial leads No old tracing to compare Confirmed by Meridee Score [id number])  on 10/05/2020 5:02:22 PM   Radiology CT Angio Chest PE W/Cm &/Or Wo Cm  Result Date: 10/05/2020 CLINICAL DATA:  PE suspected. Positive D-dimer. The patient is COVID positive. EXAM: CT ANGIOGRAPHY CHEST WITH CONTRAST  TECHNIQUE: Multidetector CT imaging of the chest was performed using the standard protocol during bolus administration of intravenous contrast. Multiplanar CT image reconstructions and MIPs were obtained to evaluate the vascular anatomy. CONTRAST:  OMNIPAQUE IOHEXOL 350 MG/ML SOLN COMPARISON:  None. FINDINGS: Cardiovascular: Evaluation is significantly limited by respiratory motion artifact. Within that limitation, there is no central pulmonary embolus. The size of the main pulmonary artery is normal. Heart size is normal, with no pericardial effusion. The course and caliber of the aorta are normal. There is no atherosclerotic calcification. Opacification decreased due to pulmonary arterial phase contrast bolus timing. Mediastinum/Nodes: -- No mediastinal lymphadenopathy. --there are mildly enlarged right hilar lymph nodes, likely reactive. -- No axillary lymphadenopathy. -- No supraclavicular lymphadenopathy. -- Normal thyroid gland where visualized. -  Unremarkable esophagus. Lungs/Pleura: There are ground-glass airspace opacities at the lung bases bilaterally, right worse than left. There is no pneumothorax. There is a trace right-sided pleural effusion. Upper Abdomen: Contrast bolus timing is not optimized for evaluation of the abdominal organs. The visualized portions of the organs of the upper abdomen are normal. Musculoskeletal: No chest wall abnormality. No bony spinal canal stenosis. Review of the MIP images confirms the above findings. IMPRESSION: 1. Evaluation is significantly limited by respiratory motion artifact. Within that limitation, there is no central pulmonary embolus. 2. Bilateral ground-glass airspace opacities, right worse than left, with a trace right-sided pleural effusion. Findings are consistent with an atypical infectious process such as viral pneumonia. 3. Mildly enlarged right hilar lymph nodes, likely reactive. Electronically Signed   By: Katherine Mantle M.D.   On: 10/05/2020  19:22   DG Chest Portable 1 View  Result Date: 10/05/2020 CLINICAL DATA:  Shortness of breath EXAM: PORTABLE CHEST 1 VIEW COMPARISON:  None. FINDINGS: The heart size and mediastinal contours are within normal limits. Both lungs are clear. The visualized skeletal structures are unremarkable. IMPRESSION: No active disease. Electronically Signed   By: Jasmine Pang M.D.   On: 10/05/2020 16:24    Procedures Procedures (including critical care time)  Medications Ordered in ED Medications  sodium chloride 0.9 % bolus 1,000 mL (1,000 mLs Intravenous New Bag/Given 10/05/20 1702)  dexamethasone (DECADRON) injection 8 mg (8 mg Intravenous Given 10/05/20 1703)  acetaminophen (TYLENOL) tablet 1,000 mg (1,000 mg Oral Given 10/05/20 1703)  iohexol (OMNIPAQUE) 350 MG/ML injection 100 mL (100 mLs Intravenous Contrast Given 10/05/20 1851)    ED Course  I have reviewed the triage vital signs and the nursing notes.  Pertinent labs & imaging results that were available during my care of the patient were reviewed by me and considered in my medical decision making (see chart for details).  44 year old female presents for evaluation of upper respiratory complaints as well as shortness of breath.  On arrival Jenny Little has low-grade temp, tachycardic.  Jenny Little does not appear septic however appears to not feel well.  Known Covid exposure 9 days ago.  Jenny Little is unvaccinated.  Has been seen by PCP twice.  Started on azithromycin without relief of symptoms.  Patient with fatigue, malaise, fever, productive cough at home.  No unilateral leg swelling, redness or warmth.  Clinically no evidence of DVT on exam.  Does have some chest tightness however feels this is due to coughing.  Abdomen soft, nontender.  Lungs without adventitious breath sounds.  Plan on labs, imaging and reassess:  Labs and imaging personally reviewed and interpreted:  Covid test positive EKG without STEMI Chest x-ray without infiltrates, cardiomegaly,  pulmonary edema, pneumothorax Trop 2 CBC without leukocytosis CMP with mild elevation in glucose however no additional electrolyte, renal or liver abnormality  D-Dimer 0.53 CTA negative for large PE  Patient reassessed. Ambulatory from triage to room without hypoxia however does have tachycardia to 120-130's. Will start IVF, steroids and reassess.  Patient reassessed. Tachycardia improved. Pending CTA chest given elevated D-dimer.  Patient reassessed. CTA negative for large PE however was motion degraded. Jenny Little does have bilateral pneumonia consistent with her Covid infection. Patient is ready ambulated. Jenny Little does have mild tachycardia into the 1 teens with ambulation however no hypoxia. Dyspnea significantly improved with steroids here in the emergency department. I offered patient MAB infusion. Patient states Jenny Little does not want to wait as it is late and Jenny Little would like to go home. I discussed close follow-up with PCP. Patient will return for any worsening symptoms.  The patient has been appropriately medically screened and/or stabilized in the ED. I have low suspicion for any other emergent medical condition which would require further screening, evaluation or treatment in the ED or require inpatient management.  Patient is hemodynamically stable and in no acute distress.  Patient able to ambulate in department prior to ED.  Evaluation does not show acute pathology that would require ongoing or additional emergent interventions while in the emergency department or further inpatient treatment.  I have discussed the diagnosis with the patient and answered all questions.  Pain is been managed while in the emergency department and patient has no further complaints prior to discharge.  Patient is comfortable with plan discussed in room and is stable for discharge at this time.  I have discussed strict return precautions for returning to the emergency department.  Patient was encouraged to follow-up with  PCP/specialist refer to at discharge.     MDM Rules/Calculators/A&P                          Edilia Ghuman was evaluated in Emergency Department on 10/05/2020 for the symptoms described in the history of present illness. Jenny Little was evaluated in the context of the global COVID-19 pandemic, which necessitated consideration that the patient might be at risk for infection with the SARS-CoV-2 virus that causes COVID-19. Institutional protocols and algorithms that pertain to the evaluation of patients at risk for COVID-19 are in a state of rapid change based on information released by regulatory bodies including the CDC and federal and state organizations. These policies and algorithms were followed during the patient's care in the ED. Final Clinical Impression(s) / ED Diagnoses Final diagnoses:  Pneumonia due to COVID-19 virus  Shortness of breath    Rx / DC Orders ED Discharge Orders         Ordered    benzonatate (TESSALON) 100 MG capsule  Every 8 hours        10/05/20 1935    dexamethasone (DECADRON) 6 MG tablet  Daily        10/05/20 1935    albuterol (VENTOLIN HFA) 108 (90 Base) MCG/ACT inhaler  Every 6 hours PRN        10/05/20 1935    ondansetron (ZOFRAN ODT) 4 MG disintegrating tablet  Every 8 hours PRN        10/05/20 1935  Linwood DibblesHenderly, Jeniece Hannis A, PA-C 10/05/20 1937    Terrilee FilesButler, Michael C, MD 10/06/20 1026

## 2020-10-05 NOTE — Progress Notes (Signed)
I connected with  Jenny Little on 10/05/20 by a video enabled telemedicine application and verified that I am speaking with the correct person using two identifiers.   I discussed the limitations of evaluation and management by telemedicine. The patient expressed understanding and agreed to proceed.

## 2020-10-05 NOTE — Telephone Encounter (Signed)
Patient called stating that she is experiencing loose stool. No fever since Saturday. She wanted to be seen in office. She took a home covid test which came out negative. She saw Dr. Milinda Cave last Friday and he put her on an antibiotic. Please advise!

## 2020-10-05 NOTE — ED Triage Notes (Signed)
Pt has been sick for 9 days. States she has been seen virtually 2 times and feeling weakness. Fever on Saturday and none since. States she has taken 2 home tests that were negative. Sent her today after virtual visit where she couldn't finish a sentence.

## 2020-10-05 NOTE — Telephone Encounter (Signed)
Pt called in stating that she is very weak, she states that she has loose stool. No fever since Saturday. She wanted to be seen in office. She took a at home covid test and it came back negative. She saw Dr. Milinda Cave last Friday and he put her on an antibiotic.   Please advise

## 2020-10-05 NOTE — Progress Notes (Signed)
Virtual Visit via Video   I connected with patient on 10/05/20 at  2:00 PM EST by a video enabled telemedicine application and verified that I am speaking with the correct person using two identifiers.  Location patient: Home Location provider: [place] [facility], Office Persons participating in the virtual visit: Patient, Provider, CMA (Sabrina M)  I discussed the limitations of evaluation and management by telemedicine and the availability of in person appointments. The patient expressed understanding and agreed to proceed.  Subjective:   HPI:   URI- sxs started Wednesday night w/ sinus congestion and drainage.  Was started on abx on Friday (Zpack).  [facility]- both were negative.  Continues to have pressure around her eyes.  Has wet cough, 'my chest was popping earlier today'.  'it feels like I have pneumonia'- pt has had pneumonia previously.  + COVID contact- friend is currently hospitalized w/ COVID pneumonia.  + fatigue- 'i've been bed bound for 9 days'.  Pt reports she is unable to walk downstairs w/o feeling like she is 'breathing heavy'.  + sweats and chills.  No body aches since Saturday when fever broke.  + nausea.  Not vaccinated.  ROS:   See pertinent positives and negatives per HPI.  Patient Active Problem List   Diagnosis Date Noted  . Generalized anxiety disorder 05/15/2016  . Irregular menses 05/15/2016  . FH: colon cancer 02/25/2016  . Anemia 11/13/2015  . Encounter to establish care 10/25/2015  . Attention deficit hyperactivity disorder (ADHD) 10/25/2015  . Breast cancer screening 10/25/2015  . Encounter for preventive health examination 10/25/2015  . Obesity (BMI 30.0-34.9) 10/25/2015    Social History   Tobacco Use  . Smoking status: Never Smoker  . Smokeless tobacco: Never Used  Substance Use Topics  . Alcohol use: No    Current Outpatient Medications:  .  amphetamine-dextroamphetamine (ADDERALL) 15 MG tablet, Take 1 tablet by mouth 3  (three) times daily., Disp: 90 tablet, Rfl: 0 .  cholecalciferol (VITAMIN D) 1000 units tablet, Take 1,000 Units by mouth daily., Disp: , Rfl:  .  citalopram (CELEXA) 20 MG tablet, TAKE 1 TABLET BY MOUTH EVERY DAY, Disp: 90 tablet, Rfl: 1 .  Cyanocobalamin (VITAMIN B 12 PO), Take by mouth., Disp: , Rfl:  .  fluticasone (FLONASE) 50 MCG/ACT nasal spray, Place 1 spray into both nostrils daily., Disp: 16 g, Rfl: 6 .  azithromycin (ZITHROMAX) 250 MG tablet, 2 tabs po qd x 1d, then 1 tab po qd x 4d (Patient not taking: Reported on 10/05/2020), Disp: 6 tablet, Rfl: 0 .  ferrous sulfate (FERROUSUL) 325 (65 FE) MG tablet, Take 1 tablet (325 mg total) by mouth 3 (three) times daily with meals. (Patient not taking: Reported on 10/05/2020), Disp: 90 tablet, Rfl: 1 .  meclizine (ANTIVERT) 25 MG tablet, Take 1 tablet (25 mg total) by mouth 3 (three) times daily as needed for dizziness. (Patient not taking: Reported on 09/28/2020), Disp: 45 tablet, Rfl: 0  No Known Allergies  Objective:   There were no vitals taken for this visit. AAOx3, obviously not feeling well NCAT, EOMI No obvious CN deficits Pale Hacking cough, breathy voice.  Unable to speak more than 1-2 sentences without SOB.  Possible audible crackles Thought process is linear.  Mood is appropriate.   Assessment and Plan:   SOB w/ known COVID exposure and suspected COVID infxn- pt has been sick for 9 days at this point.  She tells me that her good friend (who  she has spent time w/) is currently hospitalized w/ COVID PNA.  Pt is not vaccinated.  She is having difficulty speaking more than 1-2 sentences w/o audible SOB.  + hacking cough.  Excessive fatigue, chills, sweats.  Explained that I am almost certain she has COVID despite the negative home tests.  Given her SOB w/ speaking, she needs to go to the ER.  She initially just wanted me to give her more abx and steroids but I explained that PEs are a complication of COVID and prednisone won't help  that situation.  Given severity of her situation, she needs to be evaluated immediately and possibly admitted depending on vital signs and imaging.  Pt expressed understanding and is in agreement w/ plan.   Neena Rhymes, MD 10/05/2020

## 2020-10-05 NOTE — Discharge Instructions (Signed)
Zofran as needed for nausea  Tessalon Perles as needed for cough  Albuterol for shortness of breath  Decadron for Covid infection  Follow with PCP in 2 days for reevaluation. Return for any worsening symptoms.

## 2020-10-11 ENCOUNTER — Encounter: Payer: Self-pay | Admitting: Family Medicine

## 2020-10-22 ENCOUNTER — Other Ambulatory Visit: Payer: Self-pay | Admitting: Family Medicine

## 2020-10-22 MED ORDER — AMPHETAMINE-DEXTROAMPHETAMINE 15 MG PO TABS
15.0000 mg | ORAL_TABLET | Freq: Three times a day (TID) | ORAL | 0 refills | Status: DC
Start: 2020-10-22 — End: 2020-12-05

## 2020-10-22 NOTE — Telephone Encounter (Signed)
Adderall last rx 09/19/20 #90 LOV: 05/03/20 ADD, 10/05/20 Suspect Covid CSC: 04/08/18

## 2020-10-23 ENCOUNTER — Ambulatory Visit (INDEPENDENT_AMBULATORY_CARE_PROVIDER_SITE_OTHER): Payer: BC Managed Care – PPO | Admitting: Family Medicine

## 2020-10-23 ENCOUNTER — Other Ambulatory Visit: Payer: Self-pay

## 2020-10-23 ENCOUNTER — Encounter: Payer: Self-pay | Admitting: Family Medicine

## 2020-10-23 VITALS — BP 122/70 | HR 101 | Temp 97.8°F | Resp 20 | Ht 67.5 in | Wt 210.8 lb

## 2020-10-23 DIAGNOSIS — E669 Obesity, unspecified: Secondary | ICD-10-CM

## 2020-10-23 DIAGNOSIS — Z23 Encounter for immunization: Secondary | ICD-10-CM

## 2020-10-23 DIAGNOSIS — Z0000 Encounter for general adult medical examination without abnormal findings: Secondary | ICD-10-CM

## 2020-10-23 DIAGNOSIS — E559 Vitamin D deficiency, unspecified: Secondary | ICD-10-CM | POA: Insufficient documentation

## 2020-10-23 DIAGNOSIS — Z1211 Encounter for screening for malignant neoplasm of colon: Secondary | ICD-10-CM

## 2020-10-23 LAB — LIPID PANEL
Cholesterol: 251 mg/dL — ABNORMAL HIGH (ref 0–200)
HDL: 48.9 mg/dL (ref >39.00)
NonHDL: 201.7
Total CHOL/HDL Ratio: 5
Triglycerides: 300 mg/dL — ABNORMAL HIGH (ref 0.0–149.0)
VLDL: 60 mg/dL — ABNORMAL HIGH (ref 0.0–40.0)

## 2020-10-23 LAB — HEPATIC FUNCTION PANEL
ALT: 18 U/L (ref 0–35)
AST: 16 U/L (ref 0–37)
Albumin: 4.1 g/dL (ref 3.5–5.2)
Alkaline Phosphatase: 50 U/L (ref 39–117)
Bilirubin, Direct: 0.1 mg/dL (ref 0.0–0.3)
Total Bilirubin: 0.7 mg/dL (ref 0.2–1.2)
Total Protein: 6.8 g/dL (ref 6.0–8.3)

## 2020-10-23 LAB — CBC WITH DIFFERENTIAL/PLATELET
Basophils Absolute: 0.1 10*3/uL (ref 0.0–0.1)
Basophils Relative: 1.2 % (ref 0.0–3.0)
Eosinophils Absolute: 0.2 10*3/uL (ref 0.0–0.7)
Eosinophils Relative: 3.8 % (ref 0.0–5.0)
HCT: 37 % (ref 36.0–46.0)
Hemoglobin: 12.4 g/dL (ref 12.0–15.0)
Lymphocytes Relative: 28.2 % (ref 12.0–46.0)
Lymphs Abs: 1.8 10*3/uL (ref 0.7–4.0)
MCHC: 33.4 g/dL (ref 30.0–36.0)
MCV: 83.6 fl (ref 78.0–100.0)
Monocytes Absolute: 0.5 10*3/uL (ref 0.1–1.0)
Monocytes Relative: 7.6 % (ref 3.0–12.0)
Neutro Abs: 3.7 10*3/uL (ref 1.4–7.7)
Neutrophils Relative %: 59.2 % (ref 43.0–77.0)
Platelets: 269 10*3/uL (ref 150.0–400.0)
RBC: 4.43 Mil/uL (ref 3.87–5.11)
RDW: 15.5 % (ref 11.5–15.5)
WBC: 6.3 10*3/uL (ref 4.0–10.5)

## 2020-10-23 LAB — BASIC METABOLIC PANEL
BUN: 11 mg/dL (ref 6–23)
CO2: 29 mEq/L (ref 19–32)
Calcium: 8.8 mg/dL (ref 8.4–10.5)
Chloride: 103 mEq/L (ref 96–112)
Creatinine, Ser: 0.71 mg/dL (ref 0.40–1.20)
GFR: 102.15 mL/min (ref >60.00)
Glucose, Bld: 87 mg/dL (ref 70–99)
Potassium: 4.3 mEq/L (ref 3.5–5.1)
Sodium: 139 mEq/L (ref 135–145)

## 2020-10-23 LAB — LDL CHOLESTEROL, DIRECT: Direct LDL: 153 mg/dL

## 2020-10-23 LAB — VITAMIN D 25 HYDROXY (VIT D DEFICIENCY, FRACTURES): VITD: 35.9 ng/mL (ref 30.00–100.00)

## 2020-10-23 LAB — TSH: TSH: 1.95 u[IU]/mL (ref 0.35–4.50)

## 2020-10-23 NOTE — Progress Notes (Signed)
   Subjective:    Patient ID: Jenny Little, female    DOB: Sep 29, 1976, 44 y.o.   MRN: [id number]  HPI CPE- UTD on pap, mammo (scheduled at [facility]).  UTD on Tdap.  Due for flu and COVID (has to wait 90 days s/p antibody infusion)  Reviewed past medical, surgical, family and social histories.   Health Maintenance  Topic Date Due  . INFLUENZA VACCINE  10/28/2020 (Originally 06/24/2020)  . COVID-19 Vaccine (1) 10/28/2020 (Originally 08/26/1986)  . PAP SMEAR-Modifier  11/23/2020 (Originally 07/25/2018)  . Hepatitis C Screening  05/03/2021 (Originally [birth year])  . HIV Screening  10/05/2021 (Originally 08/26/1989)  . TETANUS/TDAP  10/24/2024      Review of Systems Patient reports no vision/ hearing changes, adenopathy,fever, weight change,  persistant/recurrent hoarseness , swallowing issues, chest pain, palpitations, edema, persistant/recurrent cough, hemoptysis, dyspnea (rest/exertional/paroxysmal nocturnal), gastrointestinal bleeding (melena, rectal bleeding), abdominal pain, significant heartburn, bowel changes, GU symptoms (dysuria, hematuria, incontinence), Gyn symptoms (abnormal  bleeding, pain),  syncope, focal weakness, memory loss, numbness & tingling, skin/hair/nail changes, abnormal bruising or bleeding, anxiety, or depression.   This visit occurred during the SARS-CoV-2 public health emergency.  Safety protocols were in place, including screening questions prior to the visit, additional usage of staff PPE, and extensive cleaning of exam room while observing appropriate contact time as indicated for disinfecting solutions.       Objective:   Physical Exam General Appearance:    Alert, cooperative, no distress, appears stated age, obese  Head:    Normocephalic, without obvious abnormality, atraumatic  Eyes:    PERRL, conjunctiva/corneas clear, EOM's intact, fundi    benign, both eyes  Ears:    Normal TM's and external ear canals, both ears  Nose:   Deferred due to COVID  Throat:     Neck:   Supple, symmetrical, trachea midline, no adenopathy;    Thyroid: no enlargement/tenderness/nodules  Back:     Symmetric, no curvature, ROM normal, no CVA tenderness  Lungs:     Clear to auscultation bilaterally, respirations unlabored  Chest Wall:    No tenderness or deformity   Heart:    Regular rate and rhythm, S1 and S2 normal, no murmur, rub   or gallop  Breast Exam:    Deferred to mammo  Abdomen:     Soft, non-tender, bowel sounds active all four quadrants,    no masses, no organomegaly  Genitalia:    Deferred to GYN  Rectal:    Extremities:   Extremities normal, atraumatic, no cyanosis or edema  Pulses:   2+ and symmetric all extremities  Skin:   Skin color, texture, turgor normal, no rashes or lesions  Lymph nodes:   Cervical, supraclavicular, and axillary nodes normal  Neurologic:   CNII-XII intact, normal strength, sensation and reflexes    throughout          Assessment & Plan:

## 2020-10-23 NOTE — Assessment & Plan Note (Signed)
Ongoing issue for pt.  She has hired a Patent examiner to help her work on her weight and the emotions surrounding it.  Applauded her efforts.  Encouraged healthy diet and regular exercise.  Will check labs to risk stratify

## 2020-10-23 NOTE — Assessment & Plan Note (Signed)
Pt has hx of this.  Check labs and replete prn. 

## 2020-10-23 NOTE — Patient Instructions (Addendum)
Follow up in 6 months to weight loss progress and cholesterol We'll notify you of your lab results and make any changes if needed Continue to work on healthy diet and regular exercise- you can do it!! Call and schedule your pap at your convenience We'll call you with your GI appt about the colonoscopy Call with any questions or concerns Happy Holidays!!!

## 2020-10-23 NOTE — Assessment & Plan Note (Signed)
Pt's PE WNL w/ exception of obesity.  Has mammo scheduled.  Pt to schedule pap.  Will refer to GI for colonoscopy.  Flu shot given.  Check labs.  Anticipatory guidance provided.

## 2020-11-20 ENCOUNTER — Other Ambulatory Visit: Payer: Self-pay | Admitting: Family Medicine

## 2020-11-21 ENCOUNTER — Encounter: Payer: Self-pay | Admitting: Family Medicine

## 2020-12-05 ENCOUNTER — Encounter: Payer: Self-pay | Admitting: Family Medicine

## 2020-12-05 ENCOUNTER — Other Ambulatory Visit: Payer: Self-pay | Admitting: Family Medicine

## 2020-12-05 DIAGNOSIS — F902 Attention-deficit hyperactivity disorder, combined type: Secondary | ICD-10-CM

## 2020-12-05 NOTE — Telephone Encounter (Signed)
Adderall last rx 10/22/20 #90 LOV: 10/23/20 CPE CSC: 04/08/18

## 2020-12-06 NOTE — Telephone Encounter (Signed)
Last request she wanted to go off of medication because it was causing side effects.  Now she is wanting to restart.  We will leave for PCP to determine refill.  Patient already notified via MyChart that we are leaving for doctor Tabori to review.

## 2020-12-06 NOTE — Telephone Encounter (Signed)
Pt called in asking if we could send in a refill on the Adderall please advise

## 2020-12-10 MED ORDER — AMPHETAMINE-DEXTROAMPHETAMINE 15 MG PO TABS: 15.0000 mg | ORAL_TABLET | Freq: Three times a day (TID) | ORAL | 0 refills | Status: DC

## 2020-12-14 ENCOUNTER — Other Ambulatory Visit: Payer: Self-pay | Admitting: Family Medicine

## 2020-12-17 ENCOUNTER — Encounter: Payer: Self-pay | Admitting: Family Medicine

## 2020-12-17 ENCOUNTER — Telehealth (INDEPENDENT_AMBULATORY_CARE_PROVIDER_SITE_OTHER): Payer: BC Managed Care – PPO | Admitting: Family Medicine

## 2020-12-17 DIAGNOSIS — J029 Acute pharyngitis, unspecified: Secondary | ICD-10-CM

## 2020-12-17 DIAGNOSIS — J0100 Acute maxillary sinusitis, unspecified: Secondary | ICD-10-CM | POA: Diagnosis not present

## 2020-12-17 MED ORDER — AMOXICILLIN 875 MG PO TABS: 875.0000 mg | ORAL_TABLET | Freq: Two times a day (BID) | ORAL | 0 refills | Status: DC

## 2020-12-17 NOTE — Progress Notes (Signed)
   Virtual Visit via Video   I connected with patient on 12/17/20 at  9:30 AM EST by a video enabled telemedicine application and verified that I am speaking with the correct person using two identifiers.  Location patient: Home Location provider: [place] [facility], Office Persons participating in the virtual visit: Patient, Provider, CMA (Sabrina M)  I discussed the limitations of evaluation and management by telemedicine and the availability of in person appointments. The patient expressed understanding and agreed to proceed.  Subjective:   HPI:   Sore throat- 'I think I got strep throat'.  sxs started Saturday w/ sore throat.  + head congestion.  + maxillary sinus pain- improved w/ ibuprofen.  No fevers.  L sided cervical LAD.  Husband looked in throat and reports L tonsil had white patch.  No tooth pain.  Bilateral ear fullness.  No known sick contacts.  Had COVID in November.  + green nasal congestion.  ROS:   See pertinent positives and negatives per HPI.  Patient Active Problem List   Diagnosis Date Noted  . Vitamin D deficiency 10/23/2020  . Generalized anxiety disorder 05/15/2016  . Irregular menses 05/15/2016  . FH: colon cancer 02/25/2016  . Anemia 11/13/2015  . Attention deficit hyperactivity disorder (ADHD) 10/25/2015  . Breast cancer screening 10/25/2015  . Encounter for preventive health examination 10/25/2015  . Obesity (BMI 30.0-34.9) 10/25/2015    Social History   Tobacco Use  . Smoking status: Never Smoker  . Smokeless tobacco: Never Used  Substance Use Topics  . Alcohol use: No    Current Outpatient Medications:  .  amphetamine-dextroamphetamine (ADDERALL) 15 MG tablet, Take 1 tablet by mouth 3 (three) times daily., Disp: 90 tablet, Rfl: 0 .  cholecalciferol (VITAMIN D) 1000 units tablet, Take 1,000 Units by mouth daily., Disp: , Rfl:  .  Cyanocobalamin (VITAMIN B 12 PO), Take by mouth., Disp: , Rfl:  .  fluticasone (FLONASE) 50 MCG/ACT nasal  spray, Place 1 spray into both nostrils daily., Disp: 16 g, Rfl: 6  No Known Allergies  Objective:   There were no vitals taken for this visit. AAOx3, NAD NCAT, EOMI No obvious CN deficits Coloring WNL + TTP over maxillary sinuses Pt is able to speak clearly, coherently without shortness of breath or increased work of breathing.  Thought process is linear.  Mood is appropriate.   Assessment and Plan:   Sinusitis/pharyngitis- new.  Pt had COVID in November but possibly had Delta as she had PNA.  Discussed that this could be the Omicron variant as that tends to be more upper respiratory.  Pt reports she will get a test.  In the meantime, will cover for possible bacterial infxn w/ Amoxicillin.  Reviewed supportive care and red flags that should prompt return.  Pt expressed understanding and is in agreement w/ plan.    Neena Rhymes, MD 12/17/2020

## 2020-12-17 NOTE — Progress Notes (Signed)
I connected with  Jenny Little on 12/17/20 by a video enabled telemedicine application and verified that I am speaking with the correct person using two identifiers.   I discussed the limitations of evaluation and management by telemedicine. The patient expressed understanding and agreed to proceed.

## 2021-01-06 ENCOUNTER — Other Ambulatory Visit: Payer: Self-pay | Admitting: Family Medicine

## 2021-01-06 DIAGNOSIS — F902 Attention-deficit hyperactivity disorder, combined type: Secondary | ICD-10-CM

## 2021-01-07 MED ORDER — AMPHETAMINE-DEXTROAMPHETAMINE 15 MG PO TABS: 15.0000 mg | ORAL_TABLET | Freq: Three times a day (TID) | ORAL | 0 refills | Status: DC

## 2021-01-07 NOTE — Telephone Encounter (Signed)
Adderall last rx 12/10/20 #90 LOV: 10/23/20 CPE CSC: 04/08/18

## 2021-01-31 ENCOUNTER — Other Ambulatory Visit: Payer: Self-pay | Admitting: Family Medicine

## 2021-01-31 DIAGNOSIS — F902 Attention-deficit hyperactivity disorder, combined type: Secondary | ICD-10-CM

## 2021-01-31 NOTE — Telephone Encounter (Signed)
Requesting:Adderall 10mg  Contract: UDS: Last Visit:12/17/20 v/v Next Visit:n/a Last Refill:01/07/21 90 tabs 0 refills  Please Advise

## 2021-02-01 MED ORDER — AMPHETAMINE-DEXTROAMPHETAMINE 15 MG PO TABS: 15.0000 mg | ORAL_TABLET | Freq: Three times a day (TID) | ORAL | 0 refills | Status: DC

## 2021-02-25 ENCOUNTER — Other Ambulatory Visit: Payer: Self-pay | Admitting: Family Medicine

## 2021-02-25 DIAGNOSIS — F902 Attention-deficit hyperactivity disorder, combined type: Secondary | ICD-10-CM

## 2021-02-25 MED ORDER — AMPHETAMINE-DEXTROAMPHETAMINE 15 MG PO TABS: 15.0000 mg | ORAL_TABLET | Freq: Three times a day (TID) | ORAL | 0 refills | Status: DC

## 2021-02-25 NOTE — Telephone Encounter (Signed)
Patient is requesting a refill of the following medications: Requested Prescriptions   Pending Prescriptions Disp Refills  . amphetamine-dextroamphetamine (ADDERALL) 15 MG tablet 90 tablet 0    Sig: Take 1 tablet by mouth 3 (three) times daily.    Date of patient request:02/25/2021 Last office visit: 10/23/2020 Date of last refill:02/01/2021 Last refill amount: 90 tablets  Follow up time period per chart: N/A

## 2021-03-25 ENCOUNTER — Encounter: Payer: Self-pay | Admitting: Family Medicine

## 2021-03-25 ENCOUNTER — Telehealth (INDEPENDENT_AMBULATORY_CARE_PROVIDER_SITE_OTHER): Payer: BC Managed Care – PPO | Admitting: Family Medicine

## 2021-03-25 DIAGNOSIS — B9689 Other specified bacterial agents as the cause of diseases classified elsewhere: Secondary | ICD-10-CM | POA: Diagnosis not present

## 2021-03-25 DIAGNOSIS — J329 Chronic sinusitis, unspecified: Secondary | ICD-10-CM | POA: Diagnosis not present

## 2021-03-25 MED ORDER — AMOXICILLIN 875 MG PO TABS
875.0000 mg | ORAL_TABLET | Freq: Two times a day (BID) | ORAL | 0 refills | Status: DC
Start: 2021-03-25 — End: 2021-08-16

## 2021-03-25 NOTE — Progress Notes (Signed)
I connected with  Jenny Little on 03/25/21 by a video enabled telemedicine application and verified that I am speaking with the correct person using two identifiers.   I discussed the limitations of evaluation and management by telemedicine. The patient expressed understanding and agreed to proceed.

## 2021-03-25 NOTE — Progress Notes (Signed)
   Virtual Visit via Video   I connected with patient on 03/25/21 at 11:00 AM EDT by a video enabled telemedicine application and verified that I am speaking with the correct person using two identifiers.  Location patient: Home Location provider: [place] [facility], Office Persons participating in the virtual visit: Patient, Provider, CMA (Sabrina M)  I discussed the limitations of evaluation and management by telemedicine and the availability of in person appointments. The patient expressed understanding and agreed to proceed.  Subjective:   HPI:   URI- sxs started 2-3 weeks ago w/ nasal congestion, seasonal allergies.  Using allergy medication w/  Some improvement.  Sxs returned late last week but this time she had vertigo, sinus pain/pressure, L ear pain/pressure.  + HA.  + upper tooth pain.  + nausea w/ vertigo.  No fevers/chills/body aches.  Feels 'kinda weak'.    ROS:   See pertinent positives and negatives per HPI.  Patient Active Problem List   Diagnosis Date Noted  . Vitamin D deficiency 10/23/2020  . Generalized anxiety disorder 05/15/2016  . Irregular menses 05/15/2016  . FH: colon cancer 02/25/2016  . Anemia 11/13/2015  . Attention deficit hyperactivity disorder (ADHD) 10/25/2015  . Breast cancer screening 10/25/2015  . Encounter for preventive health examination 10/25/2015  . Obesity (BMI 30.0-34.9) 10/25/2015    Social History   Tobacco Use  . Smoking status: Never Smoker  . Smokeless tobacco: Never Used  Substance Use Topics  . Alcohol use: No    Current Outpatient Medications:  .  amphetamine-dextroamphetamine (ADDERALL) 15 MG tablet, Take 1 tablet by mouth 3 (three) times daily., Disp: 90 tablet, Rfl: 0 .  cholecalciferol (VITAMIN D) 1000 units tablet, Take 1,000 Units by mouth daily., Disp: , Rfl:  .  Cyanocobalamin (VITAMIN B 12 PO), Take by mouth., Disp: , Rfl:  .  fluticasone (FLONASE) 50 MCG/ACT nasal spray, Place 1 spray into both nostrils  daily., Disp: 16 g, Rfl: 6 .  amoxicillin (AMOXIL) 875 MG tablet, Take 1 tablet (875 mg total) by mouth 2 (two) times daily. (Patient not taking: Reported on 03/25/2021), Disp: 20 tablet, Rfl: 0  No Known Allergies  Objective:   There were no vitals taken for this visit. AAOx3, NAD NCAT, EOMI No obvious CN deficits Coloring WNL Pt is able to speak clearly, coherently without shortness of breath or increased work of breathing.  Thought process is linear.  Mood is appropriate.   Assessment and Plan:   Bacterial sinusitis- pt has classic 2nd sickening w/ sinus pain/pressure, tooth pain, and vertigo.  Start Amoxicillin.  Reviewed supportive care and red flags that should prompt return.  Pt expressed understanding and is in agreement w/ plan.   Neena Rhymes, MD 03/25/2021

## 2021-03-28 ENCOUNTER — Encounter: Payer: Self-pay | Admitting: Family Medicine

## 2021-03-28 ENCOUNTER — Other Ambulatory Visit: Payer: Self-pay

## 2021-03-28 ENCOUNTER — Other Ambulatory Visit: Payer: Self-pay | Admitting: Family Medicine

## 2021-03-28 DIAGNOSIS — F902 Attention-deficit hyperactivity disorder, combined type: Secondary | ICD-10-CM

## 2021-03-28 MED ORDER — AMPHETAMINE-DEXTROAMPHETAMINE 15 MG PO TABS: 15.0000 mg | ORAL_TABLET | Freq: Three times a day (TID) | ORAL | 0 refills | Status: DC

## 2021-03-28 NOTE — Telephone Encounter (Signed)
I need to change pharmacy's. Bless that new Pharmacist on [facility]. However, we are having a time getting medication for other family members in my household. [facility] [address] [phone].  Thank you, Verena Shawgo  LFD 02/25/21 #90 with no refills LOV 03/25/21 NOV 04/01/21

## 2021-04-01 ENCOUNTER — Telehealth: Payer: BC Managed Care – PPO | Admitting: Family Medicine

## 2021-04-08 DIAGNOSIS — M79642 Pain in left hand: Secondary | ICD-10-CM | POA: Diagnosis not present

## 2021-04-24 ENCOUNTER — Other Ambulatory Visit: Payer: Self-pay | Admitting: Family Medicine

## 2021-04-24 DIAGNOSIS — F902 Attention-deficit hyperactivity disorder, combined type: Secondary | ICD-10-CM

## 2021-04-24 MED ORDER — AMPHETAMINE-DEXTROAMPHETAMINE 15 MG PO TABS: 15.0000 mg | ORAL_TABLET | Freq: Three times a day (TID) | ORAL | 0 refills | Status: DC

## 2021-04-24 NOTE — Telephone Encounter (Signed)
LFD 03/28/21 #90 with no refills LOV 03/25/21 NOV none

## 2021-05-22 ENCOUNTER — Encounter: Payer: Self-pay | Admitting: *Deleted

## 2021-05-22 ENCOUNTER — Other Ambulatory Visit: Payer: Self-pay | Admitting: Family Medicine

## 2021-05-22 DIAGNOSIS — F902 Attention-deficit hyperactivity disorder, combined type: Secondary | ICD-10-CM

## 2021-05-22 NOTE — Telephone Encounter (Signed)
LFD 04/24/21 #90 with no refills LOV 03/25/21 NOV none

## 2021-05-23 MED ORDER — AMPHETAMINE-DEXTROAMPHETAMINE 15 MG PO TABS: 15.0000 mg | ORAL_TABLET | Freq: Three times a day (TID) | ORAL | 0 refills | Status: DC

## 2021-06-17 ENCOUNTER — Other Ambulatory Visit: Payer: Self-pay | Admitting: Family Medicine

## 2021-06-17 DIAGNOSIS — F902 Attention-deficit hyperactivity disorder, combined type: Secondary | ICD-10-CM

## 2021-06-17 MED ORDER — AMPHETAMINE-DEXTROAMPHETAMINE 15 MG PO TABS: 15.0000 mg | ORAL_TABLET | Freq: Three times a day (TID) | ORAL | 0 refills | Status: DC

## 2021-06-17 NOTE — Telephone Encounter (Signed)
LFD 05/23/21 #90 with no refills LOV 03/25/21 NOV none

## 2021-08-09 DIAGNOSIS — Z124 Encounter for screening for malignant neoplasm of cervix: Secondary | ICD-10-CM | POA: Diagnosis not present

## 2021-08-09 DIAGNOSIS — Z01419 Encounter for gynecological examination (general) (routine) without abnormal findings: Secondary | ICD-10-CM | POA: Diagnosis not present

## 2021-08-09 DIAGNOSIS — Z6833 Body mass index (BMI) 33.0-33.9, adult: Secondary | ICD-10-CM | POA: Diagnosis not present

## 2021-08-09 DIAGNOSIS — Z1231 Encounter for screening mammogram for malignant neoplasm of breast: Secondary | ICD-10-CM | POA: Diagnosis not present

## 2021-08-09 LAB — HM PAP SMEAR: HM Pap smear: NORMAL

## 2021-08-16 ENCOUNTER — Encounter: Payer: Self-pay | Admitting: Registered Nurse

## 2021-08-16 ENCOUNTER — Telehealth (INDEPENDENT_AMBULATORY_CARE_PROVIDER_SITE_OTHER): Payer: BC Managed Care – PPO | Admitting: Registered Nurse

## 2021-08-16 ENCOUNTER — Other Ambulatory Visit: Payer: Self-pay

## 2021-08-16 DIAGNOSIS — J329 Chronic sinusitis, unspecified: Secondary | ICD-10-CM | POA: Diagnosis not present

## 2021-08-16 DIAGNOSIS — B9689 Other specified bacterial agents as the cause of diseases classified elsewhere: Secondary | ICD-10-CM | POA: Diagnosis not present

## 2021-08-16 MED ORDER — AMOXICILLIN-POT CLAVULANATE 875-125 MG PO TABS: 1.0000 | ORAL_TABLET | Freq: Two times a day (BID) | ORAL | 0 refills | Status: DC

## 2021-08-16 NOTE — Progress Notes (Signed)
Telemedicine Encounter- SOAP NOTE Established Patient  This telephone encounter was conducted with the patient's (or proxy's) verbal consent via audio telecommunications: yes/no: Yes Patient was instructed to have this encounter in a suitably private space; and to only have persons present to whom they give permission to participate. In addition, patient identity was confirmed by use of name plus two identifiers (DOB and address).  I discussed the limitations, risks, security and privacy concerns of performing an evaluation and management service by telephone and the availability of in person appointments. I also discussed with the patient that there may be a patient responsible charge related to this service. The patient expressed understanding and agreed to proceed.  I spent a total of 17 minutes talking with the patient or their proxy.  Patient at home Provider in office  Participants: Jenny Sportsman, NP and Jettie Pagan  Chief Complaint  Patient presents with   Sinusitis    Subjective   Jenny Little is a 46 y.o. established patient. Telephone visit today for sinusitis  HPI Onset 7-10 days ago Hx of frequent sinusitis Worsening, sinus pressure, drainage, jaw and tooth pain Vertigo better than previous, but teeth are worse  Has been using neti-pot Minimal improvement  Patient Active Problem List   Diagnosis Date Noted   Vitamin D deficiency 10/23/2020   Generalized anxiety disorder 05/15/2016   Irregular menses 05/15/2016   FH: colon cancer 02/25/2016   Anemia 11/13/2015   Attention deficit hyperactivity disorder (ADHD) 10/25/2015   Breast cancer screening 10/25/2015   Encounter for preventive health examination 10/25/2015   Obesity (BMI 30.0-34.9) 10/25/2015    Past Medical History:  Diagnosis Date   ADHD (attention deficit hyperactivity disorder)    Allergy    Anxiety    Depression    Fracture, toe    Iron deficiency anemia    Meningitis spinal     bacterial-child   Migraine     Current Outpatient Medications  Medication Sig Dispense Refill   amoxicillin-clavulanate (AUGMENTIN) 875-125 MG tablet Take 1 tablet by mouth 2 (two) times daily. 20 tablet 0   amphetamine-dextroamphetamine (ADDERALL) 15 MG tablet Take 1 tablet by mouth 3 (three) times daily. 90 tablet 0   cholecalciferol (VITAMIN D) 1000 units tablet Take 1,000 Units by mouth daily.     Cyanocobalamin (VITAMIN B 12 PO) Take by mouth.     fluticasone (FLONASE) 50 MCG/ACT nasal spray Place 1 spray into both nostrils daily. 16 g 6   No current facility-administered medications for this visit.    No Known Allergies  Social History   Socioeconomic History   Marital status: Married    Spouse name: Not on file   Number of children: 2   Years of education: Not on file   Highest education level: Not on file  Occupational History   Occupation: real estate agent  Tobacco Use   Smoking status: Never   Smokeless tobacco: Never  Vaping Use   Vaping Use: Never used  Substance and Sexual Activity   Alcohol use: No   Drug use: No   Sexual activity: Yes    Comment: female   Other Topics Concern   Not on file  Social History Narrative   Married. Husband's name Onalee Hua. 2 children, Trinna Post and Marden Noble.   Bachelor's in social work. Employed full-time as a Customer service manager.   Moved to [facility] [facility] November 2016.   Takes a daily vitamin, wears her seatbelt, wears a bike helmet, exercises at least 3 times  a week.   Smoke detector located in the home.   No firearms in the home.   Patient feels safe in her relationship.   Social Determinants of Health   Financial Resource Strain: Not on file  Food Insecurity: Not on file  Transportation Needs: Not on file  Physical Activity: Not on file  Stress: Not on file  Social Connections: Not on file  Intimate Partner Violence: Not on file    Review of Systems  Constitutional: Negative.   HENT: Negative.    Eyes: Negative.    Respiratory: Negative.    Cardiovascular: Negative.   Gastrointestinal: Negative.   Genitourinary: Negative.   Musculoskeletal: Negative.   Skin: Negative.   Neurological: Negative.   Endo/Heme/Allergies: Negative.   Psychiatric/Behavioral: Negative.    All other systems reviewed and are negative.  Objective   Vitals as reported by the patient: There were no vitals filed for this visit.  [name] was seen today for sinusitis.  Diagnoses and all orders for this visit:  Bacterial sinusitis -     amoxicillin-clavulanate (AUGMENTIN) 875-125 MG tablet; Take 1 tablet by mouth 2 (two) times daily.   PLAN Augmentin po bid Encouraged use of flonase. Ok to use zyrtec 10mg  po bid for next week or so. Then reduce to once daily. Ok to continue supportive care with neti pot and OTC analgesics. Return if worsening or failing to improve Patient encouraged to call clinic with any questions, comments, or concerns.  I discussed the assessment and treatment plan with the patient. The patient was provided an opportunity to ask questions and all were answered. The patient agreed with the plan and demonstrated an understanding of the instructions.   The patient was advised to call back or seek an in-person evaluation if the symptoms worsen or if the condition fails to improve as anticipated.  I provided 17 minutes of non-face-to-face time during this encounter.  , NP

## 2021-08-27 ENCOUNTER — Other Ambulatory Visit: Payer: Self-pay | Admitting: Family Medicine

## 2021-08-27 DIAGNOSIS — F902 Attention-deficit hyperactivity disorder, combined type: Secondary | ICD-10-CM

## 2021-08-27 MED ORDER — AMPHETAMINE-DEXTROAMPHETAMINE 15 MG PO TABS: 15.0000 mg | ORAL_TABLET | Freq: Three times a day (TID) | ORAL | 0 refills | Status: DC

## 2021-08-27 NOTE — Telephone Encounter (Signed)
Patient needs to get her aderal refilled - [facility] [address] [facility]

## 2021-08-27 NOTE — Telephone Encounter (Signed)
Requesting:Adderall 15mg  Contract: UDS: Last Visit:08/16/21 v/v 08/18/21 Next Visit:n/a Last Refill:06/17/21 90 tabs 0 refills  Please Advise

## 2021-09-09 ENCOUNTER — Telehealth: Payer: Self-pay

## 2021-09-09 NOTE — Telephone Encounter (Signed)
Called patient to let her know that the medication had been approved and the approval had been faxed to the pharmacy last week and I refaxed it today.

## 2021-09-09 NOTE — Telephone Encounter (Signed)
Caller name:Jenny Little   On DPR? :Yes  Call back [phone]  Provider they see: Beverely Low   Reason for call:Pt needs pro-authorization on her amphetamine-dextroamphetamine (ADDERALL) [facility] [address] [place] - [address] [place]   [phone] [facility]?

## 2021-09-12 ENCOUNTER — Encounter: Payer: Self-pay | Admitting: Family Medicine

## 2021-09-12 ENCOUNTER — Telehealth (INDEPENDENT_AMBULATORY_CARE_PROVIDER_SITE_OTHER): Payer: BC Managed Care – PPO | Admitting: Family Medicine

## 2021-09-12 VITALS — Ht 67.5 in | Wt 213.0 lb

## 2021-09-12 DIAGNOSIS — F902 Attention-deficit hyperactivity disorder, combined type: Secondary | ICD-10-CM

## 2021-09-12 DIAGNOSIS — E669 Obesity, unspecified: Secondary | ICD-10-CM | POA: Diagnosis not present

## 2021-09-12 MED ORDER — OZEMPIC (0.25 OR 0.5 MG/DOSE) 2 MG/1.5ML ~~LOC~~ SOPN: 0.2500 mg | PEN_INJECTOR | SUBCUTANEOUS | 3 refills | Status: DC

## 2021-09-12 NOTE — Progress Notes (Signed)
   Virtual Visit via Video   I connected with patient on 09/12/21 at  2:30 PM EDT by a video enabled telemedicine application and verified that I am speaking with the correct person using two identifiers.  Location patient: Home Location provider: [facility] Persons participating in the virtual visit: Patient, Provider, CMA Tresa Endo C)  I discussed the limitations of evaluation and management by telemedicine and the availability of in person appointments. The patient expressed understanding and agreed to proceed.  Subjective:   HPI:   ADHD- chronic problem, currently on Adderall 15mg  TID but has not been taking recently.  Pt notes some decreased appetite but she doesn't 'feel good' when she takes it.  'it makes me feel blah'.  Obesity- pt reports she is exercising but she is not losing weight.  She is very frustrated w/ lack of results.  Pt reports counting carbs regularly.    ROS:   See pertinent positives and negatives per HPI.  Patient Active Problem List   Diagnosis Date Noted   Vitamin D deficiency 10/23/2020   Generalized anxiety disorder 05/15/2016   Irregular menses 05/15/2016   FH: colon cancer 02/25/2016   Anemia 11/13/2015   Attention deficit hyperactivity disorder (ADHD) 10/25/2015   Breast cancer screening 10/25/2015   Encounter for preventive health examination 10/25/2015   Obesity (BMI 30.0-34.9) 10/25/2015    Social History   Tobacco Use   Smoking status: Never   Smokeless tobacco: Never  Substance Use Topics   Alcohol use: No    Current Outpatient Medications:    amphetamine-dextroamphetamine (ADDERALL) 15 MG tablet, Take 1 tablet by mouth 3 (three) times daily., Disp: 90 tablet, Rfl: 0   cholecalciferol (VITAMIN D) 1000 units tablet, Take 1,000 Units by mouth daily., Disp: , Rfl:    citalopram (CELEXA) 20 MG tablet, Celexa 20 mg tablet  Take 1 tablet every day by oral route., Disp: , Rfl:    Cyanocobalamin (VITAMIN B 12 PO), Take by  mouth., Disp: , Rfl:    fluticasone (FLONASE) 50 MCG/ACT nasal spray, Place 1 spray into both nostrils daily., Disp: 16 g, Rfl: 6   amoxicillin-clavulanate (AUGMENTIN) 875-125 MG tablet, Take 1 tablet by mouth 2 (two) times daily. (Patient not taking: Reported on 09/12/2021), Disp: 20 tablet, Rfl: 0  No Known Allergies  Objective:   Ht 5' 7.5" (1.715 m)   Wt 213 lb (96.6 kg) Comment: pt reported  BMI 32.87 kg/m  AAOx3, NAD NCAT, EOMI No obvious CN deficits Coloring WNL Pt is able to speak clearly, coherently without shortness of breath or increased work of breathing.  Thought process is linear.  Mood is appropriate.   Assessment and Plan:   ADHD- Since pt is feeling 'blah' on TID dosing we will drop down to BID dosing and see if we can find the right balance of symptom control w/o overdoing it.  Pt expressed understanding and is in agreement w/ plan.   Obesity- deteriorated.  pt is frustrated b/c she has been exercising and counting carbs w/o any significant results.  She is already on a stimulant for ADHD so phentermine is not an option.  Will start Ozempic weekly and monitor carefully for weight loss and to also make sure she's not having symptomatic hypoglycemia.  Pt expressed understanding and is in agreement w/ plan.    09/14/2021, MD 09/12/2021

## 2021-09-13 ENCOUNTER — Telehealth: Payer: Self-pay

## 2021-09-13 NOTE — Telephone Encounter (Signed)
A PA was processed for patient for Ozempic. Waiting on the approval/denial from [facility]

## 2021-10-08 ENCOUNTER — Other Ambulatory Visit: Payer: Self-pay | Admitting: Family Medicine

## 2021-10-08 DIAGNOSIS — F902 Attention-deficit hyperactivity disorder, combined type: Secondary | ICD-10-CM

## 2021-10-08 MED ORDER — AMPHETAMINE-DEXTROAMPHETAMINE 15 MG PO TABS: 15.0000 mg | ORAL_TABLET | Freq: Three times a day (TID) | ORAL | 0 refills | Status: DC

## 2021-10-08 NOTE — Telephone Encounter (Signed)
Patient is requesting a refill of the following medications: Requested Prescriptions   Pending Prescriptions Disp Refills   amphetamine-dextroamphetamine (ADDERALL) 15 MG tablet 90 tablet 0    Sig: Take 1 tablet by mouth 3 (three) times daily.    Date of patient request: 10/08/21 Last office visit: 09/12/21 Date of last refill: 08/27/21 Last refill amount: 90

## 2021-11-06 ENCOUNTER — Telehealth (INDEPENDENT_AMBULATORY_CARE_PROVIDER_SITE_OTHER): Payer: BC Managed Care – PPO | Admitting: Family Medicine

## 2021-11-06 ENCOUNTER — Encounter: Payer: Self-pay | Admitting: Family Medicine

## 2021-11-06 DIAGNOSIS — J111 Influenza due to unidentified influenza virus with other respiratory manifestations: Secondary | ICD-10-CM | POA: Diagnosis not present

## 2021-11-06 MED ORDER — OSELTAMIVIR PHOSPHATE 75 MG PO CAPS: 75.0000 mg | ORAL_CAPSULE | Freq: Two times a day (BID) | ORAL | 0 refills | Status: DC

## 2021-11-06 MED ORDER — GUAIFENESIN-CODEINE 100-10 MG/5ML PO SYRP: 10.0000 mL | ORAL_SOLUTION | Freq: Three times a day (TID) | ORAL | 0 refills | Status: DC | PRN

## 2021-11-06 NOTE — Progress Notes (Signed)
° °  Virtual Visit via Video   I connected with patient on 11/06/21 at 10:30 AM EST by a video enabled telemedicine application and verified that I am speaking with the correct person using two identifiers.  Location patient: Home Location provider: [facility], [facility] Persons participating in the virtual visit: Patient, Provider, CMA Tresa Endo C)  I discussed the limitations of evaluation and management by telemedicine and the availability of in person appointments. The patient expressed understanding and agreed to proceed.  Subjective:   HPI:   URI- 'i feel like crap'.  Sxs started yesterday.  Started very suddenly- 'like I got hit by a bus'.  + fever.  + sick contacts.  + body aches, skin sensitivity, HA, nasal congestion, cough.  'it feels like the flu'.  Pt is interested in Tamiflu.  Taking tylenol and ibuprofen.  ROS:   See pertinent positives and negatives per HPI.  Patient Active Problem List   Diagnosis Date Noted   Vitamin D deficiency 10/23/2020   Generalized anxiety disorder 05/15/2016   Irregular menses 05/15/2016   FH: colon cancer 02/25/2016   Anemia 11/13/2015   Attention deficit hyperactivity disorder (ADHD) 10/25/2015   Breast cancer screening 10/25/2015   Encounter for preventive health examination 10/25/2015   Obesity (BMI 30.0-34.9) 10/25/2015    Social History   Tobacco Use   Smoking status: Never   Smokeless tobacco: Never  Substance Use Topics   Alcohol use: No    Current Outpatient Medications:    amphetamine-dextroamphetamine (ADDERALL) 15 MG tablet, Take 1 tablet by mouth 3 (three) times daily., Disp: 90 tablet, Rfl: 0   cholecalciferol (VITAMIN D) 1000 units tablet, Take 1,000 Units by mouth daily., Disp: , Rfl:    citalopram (CELEXA) 20 MG tablet, Celexa 20 mg tablet  Take 1 tablet every day by oral route., Disp: , Rfl:    Cyanocobalamin (VITAMIN B 12 PO), Take by mouth., Disp: , Rfl:    fluticasone (FLONASE) 50 MCG/ACT nasal spray,  Place 1 spray into both nostrils daily., Disp: 16 g, Rfl: 6   Semaglutide,0.25 or 0.5MG /DOS, (OZEMPIC, 0.25 OR 0.5 MG/DOSE,) 2 MG/1.5ML SOPN, Inject 0.25 mg into the skin once a week. For 4 weeks and then increase to 0.5mg  weekly, Disp: 1.5 mL, Rfl: 3   amoxicillin-clavulanate (AUGMENTIN) 875-125 MG tablet, Take 1 tablet by mouth 2 (two) times daily. (Patient not taking: Reported on 09/12/2021), Disp: 20 tablet, Rfl: 0  No Known Allergies  Objective:   There were no vitals taken for this visit. AAOx3, NAD, lying in bed NCAT, EOMI No obvious CN deficits + audible nasal congestion Pale Pt is able to speak clearly, coherently without shortness of breath or increased work of breathing.  + dry cough Thought process is linear.  Mood is appropriate.   Assessment and Plan:   Influenza like illness- new.  Pt's sxs are consistent w/ flu and she has known sick contact.  Unable to test since pt is home in bed but will treat w/ Tamiflu and cough syrup.  Reviewed supportive care and red flags that should prompt return.  Pt expressed understanding and is in agreement w/ plan.    Neena Rhymes, MD 11/06/2021

## 2021-11-19 ENCOUNTER — Other Ambulatory Visit: Payer: Self-pay | Admitting: Family Medicine

## 2021-11-19 DIAGNOSIS — F902 Attention-deficit hyperactivity disorder, combined type: Secondary | ICD-10-CM

## 2021-11-19 MED ORDER — AMPHETAMINE-DEXTROAMPHETAMINE 15 MG PO TABS: 15.0000 mg | ORAL_TABLET | Freq: Three times a day (TID) | ORAL | 0 refills | Status: DC

## 2021-11-19 NOTE — Telephone Encounter (Signed)
Patient is requesting a refill of the following medications: Requested Prescriptions   Pending Prescriptions Disp Refills   amphetamine-dextroamphetamine (ADDERALL) 15 MG tablet 90 tablet 0    Sig: Take 1 tablet by mouth 3 (three) times daily.    Date of patient request: 11/19/2021 Last office visit: 11/06/2021 video visit Date of last refill: 10/08/2021 Last refill amount: 90 tablets  Follow up time period per chart: n/a

## 2021-12-03 ENCOUNTER — Encounter: Payer: Self-pay | Admitting: Family Medicine

## 2021-12-19 ENCOUNTER — Other Ambulatory Visit: Payer: Self-pay | Admitting: Family Medicine

## 2021-12-19 DIAGNOSIS — F902 Attention-deficit hyperactivity disorder, combined type: Secondary | ICD-10-CM

## 2021-12-19 MED ORDER — AMPHETAMINE-DEXTROAMPHETAMINE 15 MG PO TABS: 15.0000 mg | ORAL_TABLET | Freq: Three times a day (TID) | ORAL | 0 refills | Status: DC

## 2022-01-03 ENCOUNTER — Encounter: Payer: Self-pay | Admitting: Family Medicine

## 2022-01-24 ENCOUNTER — Encounter: Payer: Self-pay | Admitting: Family Medicine

## 2022-01-24 ENCOUNTER — Ambulatory Visit: Payer: BC Managed Care – PPO | Admitting: Family Medicine

## 2022-01-24 VITALS — BP 100/64 | HR 66 | Temp 98.2°F | Resp 16 | Wt 213.6 lb

## 2022-01-24 DIAGNOSIS — M25562 Pain in left knee: Secondary | ICD-10-CM

## 2022-01-24 MED ORDER — MELOXICAM 15 MG PO TABS: 15.0000 mg | ORAL_TABLET | Freq: Every day | ORAL | 1 refills | Status: DC

## 2022-01-24 NOTE — Patient Instructions (Signed)
Schedule your complete physical in 3-4 months ?We'll call you with your Ortho appt ?ICE!!! ?START the Meloxicam once daily to stay ahead of the inflammation (avoid any extra ibuprofen, aleve, motrin, etc) you can use Tylenol as needed ?Call with any questions or concerns ?Hang in there!! ?

## 2022-01-24 NOTE — Progress Notes (Signed)
? ?  Subjective:  ? ? Patient ID: Jenny Little, female    DOB: 09-Nov-1976, 46 y.o.   MRN: [id number] ? ?HPI ?L knee pain- pt reports L leg is shorter, walks somewhat on the outside of her foot.  Now having medial knee pain.  Is stretching w/o relief.  Some relief w/ ibuprofen but this is temporary.  Pain is worse prior to onset of period.  Pain has been ongoing for ~1 yr but has recently been worsening.   ? ? ?Review of Systems ?For ROS see HPI  ? ?This visit occurred during the SARS-CoV-2 public health emergency.  Safety protocols were in place, including screening questions prior to the visit, additional usage of staff PPE, and extensive cleaning of exam room while observing appropriate contact time as indicated for disinfecting solutions.   ?   ?Objective:  ? Physical Exam ?Vitals reviewed.  ?Constitutional:   ?   General: She is not in acute distress. ?   Appearance: Normal appearance. She is obese. She is not ill-appearing.  ?HENT:  ?   Head: Normocephalic and atraumatic.  ?Cardiovascular:  ?   Pulses: Normal pulses.  ?Musculoskeletal:     ?   General: Tenderness (TTP over L medial knee) present.  ?   Comments: Full flexion and extension of L knee  ?Skin: ?   General: Skin is warm and dry.  ?   Findings: No bruising.  ?Neurological:  ?   General: No focal deficit present.  ?   Mental Status: She is alert and oriented to person, place, and time.  ?   Sensory: No sensory deficit.  ?   Motor: No weakness.  ?   Gait: Gait abnormal.  ?Psychiatric:     ?   Mood and Affect: Mood normal.     ?   Behavior: Behavior normal.     ?   Thought Content: Thought content normal.  ? ? ? ? ? ?   ?Assessment & Plan:  ?L medial knee pain- ongoing issue for pt.  Deteriorated recently after years of known leg length inequality.  Start Meloxicam daily, ice, refer to Ortho.  Pt expressed understanding and is in agreement w/ plan.  ? ?

## 2022-01-28 ENCOUNTER — Other Ambulatory Visit (HOSPITAL_BASED_OUTPATIENT_CLINIC_OR_DEPARTMENT_OTHER): Payer: Self-pay | Admitting: Orthopaedic Surgery

## 2022-01-28 DIAGNOSIS — M25562 Pain in left knee: Secondary | ICD-10-CM

## 2022-01-29 ENCOUNTER — Ambulatory Visit (HOSPITAL_BASED_OUTPATIENT_CLINIC_OR_DEPARTMENT_OTHER): Payer: BC Managed Care – PPO | Admitting: Orthopaedic Surgery

## 2022-02-07 ENCOUNTER — Other Ambulatory Visit: Payer: Self-pay

## 2022-02-07 ENCOUNTER — Other Ambulatory Visit (HOSPITAL_BASED_OUTPATIENT_CLINIC_OR_DEPARTMENT_OTHER): Payer: Self-pay | Admitting: Orthopaedic Surgery

## 2022-02-07 ENCOUNTER — Ambulatory Visit (HOSPITAL_BASED_OUTPATIENT_CLINIC_OR_DEPARTMENT_OTHER): Payer: BC Managed Care – PPO | Admitting: Orthopaedic Surgery

## 2022-02-07 ENCOUNTER — Ambulatory Visit (HOSPITAL_BASED_OUTPATIENT_CLINIC_OR_DEPARTMENT_OTHER)
Admission: RE | Admit: 2022-02-07 | Discharge: 2022-02-07 | Disposition: A | Discharge status: Home / Self Care | Payer: BC Managed Care – PPO | Source: Ambulatory Visit | Attending: Orthopaedic Surgery | Admitting: Orthopaedic Surgery

## 2022-02-07 DIAGNOSIS — M249 Joint derangement, unspecified: Secondary | ICD-10-CM | POA: Diagnosis not present

## 2022-02-07 DIAGNOSIS — M25562 Pain in left knee: Secondary | ICD-10-CM

## 2022-02-07 MED ORDER — TRIAMCINOLONE ACETONIDE 40 MG/ML IJ SUSP
80.0000 mg | INTRAMUSCULAR | Status: AC | PRN
  Administered 2022-02-07: 80 mg via INTRA_ARTICULAR

## 2022-02-07 MED ORDER — LIDOCAINE HCL 1 % IJ SOLN
4.0000 mL | INTRAMUSCULAR | Status: AC | PRN
  Administered 2022-02-07: 4 mL

## 2022-02-07 NOTE — Progress Notes (Signed)
? ?                            ? ? ?Chief Complaint: Left knee pain ?  ? ? ?History of Present Illness:  ? ? ?Jenny Little is a 46 y.o. female very pleasant female presents with left medial based knee pain which has been ongoing now for approximately a decade.  She did state that she was previously seeing an orthopedist who noticed a limb length discrepancy of the left leg is shorter than the contralateral side.  At that time she was counseled on weight loss and subsequently not followed back up.  She presents today as for the last several months she is having persistent medial based knee pain.  She is attempting to stay very active and enjoys walking multiple miles although this has become more difficult for her.  She denies any clicking or catching.  She is taking Tylenol and ibuprofen which seems to mask the pain but is not significantly relieving it.  She has not done physical therapy or injections.  She has been trying to be more active and lose weight recently although this has been difficult.  She works as a Therapist, occupationalreal estate agent locally in [place] ? ? ? ?Surgical History:   ?None ? ?PMH/PSH/Family History/Social History/Meds/Allergies:   ? ?Past Medical History:  ?Diagnosis Date  ?? ADHD (attention deficit hyperactivity disorder)   ?? Allergy   ?? Anxiety   ?? Depression   ?? Fracture, toe   ?? Iron deficiency anemia   ?? Meningitis spinal   ? bacterial-child  ?? Migraine   ? ?Past Surgical History:  ?Procedure Laterality Date  ?? BREAST SURGERY  2002  ? implants (saline)  ? ?Social History  ? ?Socioeconomic History  ?? Marital status: Married  ?  Spouse name: Not on file  ?? Number of children: 2  ?? Years of education: Not on file  ?? Highest education level: Not on file  ?Occupational History  ?? Occupation: real estate agent  ?Tobacco Use  ?? Smoking status: Never  ?? Smokeless tobacco: Never  ?Vaping Use  ?? Vaping Use: Never used  ?Substance and Sexual Activity  ?? Alcohol use: No  ?? Drug use: No   ?? Sexual activity: Yes  ?  Comment: female   ?Other Topics Concern  ?? Not on file  ?Social History Narrative  ? Married. Husband's name Onalee HuaDavid. 2 children, Trinna PostAlex and Marden NobleKemp.  ? Bachelor's in social work. Employed full-time as a Customer service managerreal estate agent.  ? [place].  ? Takes a daily vitamin, wears her seatbelt, wears a bike helmet, exercises at least 3 times a week.  ? Smoke detector located in the home.  ? No firearms in the home.  ? Patient feels safe in her relationship.  ? ?Social Determinants of Health  ? ?Financial Resource Strain: Not on file  ?Food Insecurity: Not on file  ?Transportation Needs: Not on file  ?Physical Activity: Not on file  ?Stress: Not on file  ?Social Connections: Not on file  ? ?Family History  ?Problem Relation Age of Onset  ?? Heart disease Father   ?? Diabetes Father   ?? Mental illness Father   ?     depression  ?? AAA (abdominal aortic aneurysm) Father [id number]  ?     died   ?? Hypertension Brother   ?? Colon polyps Mother   ?  x many, non-cancerous  ?? Colon cancer Maternal Grandmother 40  ?     died of colon cancer  ?? Early death Maternal Grandmother   ?? Colon cancer Paternal Grandmother 71  ?     metz to bone  ?? Early death Paternal Uncle   ?? Heart disease Paternal Uncle   ?? Heart disease Maternal Grandfather   ?? Stroke Paternal Grandfather   ? ?No Known Allergies ?Current Outpatient Medications  ?Medication Sig Dispense Refill  ?? cholecalciferol (VITAMIN D) 1000 units tablet Take 1,000 Units by mouth daily.    ?? citalopram (CELEXA) 20 MG tablet Celexa 20 mg tablet ? Take 1 tablet every day by oral route.    ?? Cyanocobalamin (VITAMIN B 12 PO) Take by mouth.    ?? fluticasone (FLONASE) 50 MCG/ACT nasal spray Place 1 spray into both nostrils daily. 16 g 6  ?? meloxicam (MOBIC) 15 MG tablet Take 1 tablet (15 mg total) by mouth daily. 30 tablet 1  ? ?No current facility-administered medications for this visit.  ? ?No results found. ? ?Review of Systems:   ?A ROS  was performed including pertinent positives and negatives as documented in the HPI. ? ?Physical Exam :   ?Constitutional: NAD and appears stated age ?Neurological: Alert and oriented ?Psych: Appropriate affect and cooperative ?There were no vitals taken for this visit.  ? ?Comprehensive Musculoskeletal Exam:   ? ?  ?Musculoskeletal Exam  ?Gait Normal  ?Alignment Normal  ? Right Left  ?Inspection Normal Normal  ?Palpation    ?Tenderness None Medial joint line  ?Crepitus None None  ?Effusion None None  ?Range of Motion    ?Extension 0 0  ?Flexion 135 135  ?Strength    ?Extension 5/5 5/5  ?Flexion 5/5 5/5  ?Ligament Exam     ?Generalized Laxity No No  ?Lachman Negative Negative   ?Pivot Shift Negative Negative  ?Anterior Drawer Negative Negative  ?Valgus at 0 Negative Negative  ?Valgus at 20 Negative Negative  ?Varus at 0 0 0  ?Varus at 20   0 0  ?Posterior Drawer at 90 0 0  ?Vascular/Lymphatic Exam    ?Edema None None  ?Venous Stasis Changes No No  ?Distal Circulation Normal Normal  ?Neurologic    ?Light Touch Sensation Intact Intact  ?Special Tests: Positive McMurray medially  ? ? ? ?Imaging:   ?Xray (4 views left knee): ?Normal ? ?I personally reviewed and interpreted the radiographs. ? ? ?Assessment:   ?46 year old female with left medial based knee pain that is been ongoing for nearly a decade.  At this time I have counseled that I believe a chronic degenerative meniscal injury would be high on the list of the differential.  At this time we will plan to start with a steroid injection in order to get her some pain relief and I would like to give her specific series of home exercises that she should work on for hip and quad strengthening.  We will also plan to order some physical therapy in order to overall improve her gait and work with her on her limb discrepancy.  I will see her back in 6 weeks for reassessment. ? ?Plan :   ? ?-Left knee ultrasound-guided injection performed after verbal consent  obtained ? ? ? ?Procedure Note ? ?Patient: Loida Calamia             ?Date of Birth: [birth year]           ?MRN: [id number]             ?  Visit Date: 02/07/2022 ? ?Procedures: ?Visit Diagnoses: No diagnosis found. ? ?Large Joint Inj: L knee on 02/07/2022 10:13 AM ?Indications: pain ?Details: 22 G 1.5 in needle, ultrasound-guided anterior approach ? ?Arthrogram: No ? ?Medications: 4 mL lidocaine 1 %; 80 mg triamcinolone acetonide 40 MG/ML ?Outcome: tolerated well, no immediate complications ?Procedure, treatment alternatives, risks and benefits explained, specific risks discussed. Consent was given by the patient. Immediately prior to procedure a time out was called to verify the correct patient, procedure, equipment, support staff and site/side marked as required. Patient was prepped and draped in the usual sterile fashion.  ? ? ? ? ? ? ? ? ?I personally saw and evaluated the patient, and participated in the management and treatment plan. ? ?Huel Cote, MD ?Attending Physician, Orthopedic Surgery ? ?This document was dictated using Conservation officer, historic buildings. A reasonable attempt at proof reading has been made to minimize errors. ?

## 2022-03-13 ENCOUNTER — Encounter: Payer: BC Managed Care – PPO | Admitting: Family Medicine

## 2022-03-21 ENCOUNTER — Ambulatory Visit (HOSPITAL_BASED_OUTPATIENT_CLINIC_OR_DEPARTMENT_OTHER): Payer: BC Managed Care – PPO | Admitting: Orthopaedic Surgery

## 2022-04-17 ENCOUNTER — Encounter: Payer: BC Managed Care – PPO | Admitting: Family Medicine

## 2022-04-23 ENCOUNTER — Encounter: Payer: BC Managed Care – PPO | Admitting: Family Medicine

## 2022-04-30 ENCOUNTER — Encounter: Payer: Self-pay | Admitting: Family Medicine

## 2022-04-30 ENCOUNTER — Ambulatory Visit (INDEPENDENT_AMBULATORY_CARE_PROVIDER_SITE_OTHER): Payer: BC Managed Care – PPO | Admitting: Family Medicine

## 2022-04-30 VITALS — BP 116/78 | HR 81 | Temp 97.0°F | Resp 16 | Ht 68.0 in | Wt 209.2 lb

## 2022-04-30 DIAGNOSIS — E669 Obesity, unspecified: Secondary | ICD-10-CM

## 2022-04-30 DIAGNOSIS — E559 Vitamin D deficiency, unspecified: Secondary | ICD-10-CM | POA: Diagnosis not present

## 2022-04-30 DIAGNOSIS — Z1211 Encounter for screening for malignant neoplasm of colon: Secondary | ICD-10-CM

## 2022-04-30 DIAGNOSIS — Z0000 Encounter for general adult medical examination without abnormal findings: Secondary | ICD-10-CM

## 2022-04-30 DIAGNOSIS — Z1231 Encounter for screening mammogram for malignant neoplasm of breast: Secondary | ICD-10-CM | POA: Diagnosis not present

## 2022-04-30 LAB — CBC WITH DIFFERENTIAL/PLATELET
Basophils Absolute: 0 10*3/uL (ref 0.0–0.1)
Basophils Relative: 0.6 % (ref 0.0–3.0)
Eosinophils Absolute: 0.1 10*3/uL (ref 0.0–0.7)
Eosinophils Relative: 1.2 % (ref 0.0–5.0)
HCT: 38.8 % (ref 36.0–46.0)
Hemoglobin: 12.7 g/dL (ref 12.0–15.0)
Lymphocytes Relative: 26.7 % (ref 12.0–46.0)
Lymphs Abs: 1.8 10*3/uL (ref 0.7–4.0)
MCHC: 32.7 g/dL (ref 30.0–36.0)
MCV: 82.8 fl (ref 78.0–100.0)
Monocytes Absolute: 0.6 10*3/uL (ref 0.1–1.0)
Monocytes Relative: 8.7 % (ref 3.0–12.0)
Neutro Abs: 4.1 10*3/uL (ref 1.4–7.7)
Neutrophils Relative %: 62.8 % (ref 43.0–77.0)
Platelets: 315 10*3/uL (ref 150.0–400.0)
RBC: 4.69 Mil/uL (ref 3.87–5.11)
RDW: 15.7 % — ABNORMAL HIGH (ref 11.5–15.5)
WBC: 6.6 10*3/uL (ref 4.0–10.5)

## 2022-04-30 LAB — HEPATIC FUNCTION PANEL
ALT: 21 U/L (ref 0–35)
AST: 20 U/L (ref 0–37)
Albumin: 4.3 g/dL (ref 3.5–5.2)
Alkaline Phosphatase: 48 U/L (ref 39–117)
Bilirubin, Direct: 0.1 mg/dL (ref 0.0–0.3)
Total Bilirubin: 0.4 mg/dL (ref 0.2–1.2)
Total Protein: 7.2 g/dL (ref 6.0–8.3)

## 2022-04-30 LAB — LIPID PANEL
Cholesterol: 228 mg/dL — ABNORMAL HIGH (ref 0–200)
HDL: 53.2 mg/dL (ref >39.00)
LDL Cholesterol: 150 mg/dL — ABNORMAL HIGH (ref 0–99)
NonHDL: 174.94
Total CHOL/HDL Ratio: 4
Triglycerides: 127 mg/dL (ref 0.0–149.0)
VLDL: 25.4 mg/dL (ref 0.0–40.0)

## 2022-04-30 LAB — BASIC METABOLIC PANEL
BUN: 12 mg/dL (ref 6–23)
CO2: 26 mEq/L (ref 19–32)
Calcium: 9.2 mg/dL (ref 8.4–10.5)
Chloride: 101 mEq/L (ref 96–112)
Creatinine, Ser: 0.62 mg/dL (ref 0.40–1.20)
GFR: 105.86 mL/min (ref >60.00)
Glucose, Bld: 90 mg/dL (ref 70–99)
Potassium: 4.5 mEq/L (ref 3.5–5.1)
Sodium: 135 mEq/L (ref 135–145)

## 2022-04-30 LAB — VITAMIN D 25 HYDROXY (VIT D DEFICIENCY, FRACTURES): VITD: 42.66 ng/mL (ref 30.00–100.00)

## 2022-04-30 LAB — TSH: TSH: 2.24 u[IU]/mL (ref 0.35–5.50)

## 2022-04-30 NOTE — Assessment & Plan Note (Signed)
Pt's PE WNL w/ exception of BMI.  Overdue for mammo- ordered.  Due for colonoscopy- referral placed.  UTD on pap, Tdap.  Check labs.  Anticipatory guidance provided.

## 2022-04-30 NOTE — Patient Instructions (Addendum)
Follow up in 1 year or as needed We'll notify you of your lab results and make any changes if needed Continue to work on healthy diet and regular exercise- you can do it! We'll call you with your GI referral for colonoscopy They'll call you to schedule your mammogram Call with any questions or concerns Stay Safe!  Stay Healthy! Have a great summer!!!

## 2022-04-30 NOTE — Assessment & Plan Note (Signed)
Pt is down 3 lbs since last visit.  Applauded her efforts.  Will check labs to risk stratify.

## 2022-04-30 NOTE — Progress Notes (Signed)
   Subjective:    Patient ID: Jenny Little, female    DOB: 12-Mar-1976, 46 y.o.   MRN: [id number]  HPI CPE- UTD on pap.  Due for mammo and colonoscopy  Health Maintenance  Topic Date Due   COLONOSCOPY (Pts 45-61yrs Insurance coverage will need to be confirmed)  09/12/2022 (Originally 08/27/2019)   INFLUENZA VACCINE  06/24/2022   PAP SMEAR-Modifier  08/09/2024   TETANUS/TDAP  10/24/2024   HPV VACCINES  Aged Out   COVID-19 Vaccine  Discontinued   Hepatitis C Screening  Discontinued   HIV Screening  Discontinued      Review of Systems Patient reports no vision/ hearing changes, adenopathy,fever, weight change,  persistant/recurrent hoarseness , swallowing issues, chest pain, palpitations, edema, persistant/recurrent cough, hemoptysis, dyspnea (rest/exertional/paroxysmal nocturnal), gastrointestinal bleeding (melena, rectal bleeding), abdominal pain, significant heartburn, bowel changes, GU symptoms (dysuria, hematuria, incontinence), Gyn symptoms (abnormal  bleeding, pain),  syncope, focal weakness, memory loss, numbness & tingling, skin/hair/nail changes, abnormal bruising or bleeding, anxiety, or depression.     Objective:   Physical Exam General Appearance:    Alert, cooperative, no distress, appears stated age, obese  Head:    Normocephalic, without obvious abnormality, atraumatic  Eyes:    PERRL, conjunctiva/corneas clear, EOM's intact both eyes  Ears:    Normal TM's and external ear canals, both ears  Nose:   Nares normal, septum midline, mucosa normal, no drainage    or sinus tenderness  Throat:   Lips, mucosa, and tongue normal; teeth and gums normal  Neck:   Supple, symmetrical, trachea midline, no adenopathy;    Thyroid: no enlargement/tenderness/nodules  Back:     Symmetric, no curvature, ROM normal, no CVA tenderness  Lungs:     Clear to auscultation bilaterally, respirations unlabored  Chest Wall:    No tenderness or deformity   Heart:    Regular rate and rhythm, S1 and  S2 normal, no murmur, rub   or gallop  Breast Exam:    Deferred to GYN  Abdomen:     Soft, non-tender, bowel sounds active all four quadrants,    no masses, no organomegaly  Genitalia:    Deferred to GYN  Rectal:    Extremities:   Extremities normal, atraumatic, no cyanosis or edema  Pulses:   2+ and symmetric all extremities  Skin:   Skin color, texture, turgor normal, no rashes or lesions  Lymph nodes:   Cervical, supraclavicular, and axillary nodes normal  Neurologic:   CNII-XII intact, normal strength, sensation and reflexes    throughout          Assessment & Plan:

## 2022-04-30 NOTE — Assessment & Plan Note (Signed)
Check labs and replete prn. 

## 2022-05-01 ENCOUNTER — Telehealth: Payer: Self-pay

## 2022-05-01 NOTE — Telephone Encounter (Signed)
Advised pt of lab results.

## 2022-05-01 NOTE — Telephone Encounter (Signed)
-----   Message from Sheliah Hatch, MD sent at 04/30/2022  9:22 PM EDT ----- Labs look great w/ exception of elevated total cholesterol and LDL (bad cholesterol).  This will improved w/ continued efforts at healthy diet and regular exercise.  In the meantime, I would add OTC Red Yeast Rice supplement daily to improve these numbers.

## 2022-05-15 ENCOUNTER — Encounter: Payer: Self-pay | Admitting: Family Medicine

## 2022-05-15 DIAGNOSIS — F419 Anxiety disorder, unspecified: Secondary | ICD-10-CM | POA: Diagnosis not present

## 2022-05-15 DIAGNOSIS — N939 Abnormal uterine and vaginal bleeding, unspecified: Secondary | ICD-10-CM | POA: Diagnosis not present

## 2022-05-15 DIAGNOSIS — N951 Menopausal and female climacteric states: Secondary | ICD-10-CM | POA: Diagnosis not present

## 2022-06-13 DIAGNOSIS — F419 Anxiety disorder, unspecified: Secondary | ICD-10-CM | POA: Diagnosis not present

## 2022-06-13 DIAGNOSIS — N939 Abnormal uterine and vaginal bleeding, unspecified: Secondary | ICD-10-CM | POA: Diagnosis not present

## 2022-07-04 ENCOUNTER — Telehealth: Payer: BC Managed Care – PPO | Admitting: Family Medicine

## 2022-07-09 ENCOUNTER — Telehealth (INDEPENDENT_AMBULATORY_CARE_PROVIDER_SITE_OTHER): Payer: BC Managed Care – PPO | Admitting: Family Medicine

## 2022-07-09 ENCOUNTER — Encounter: Payer: Self-pay | Admitting: Family Medicine

## 2022-07-09 DIAGNOSIS — F902 Attention-deficit hyperactivity disorder, combined type: Secondary | ICD-10-CM

## 2022-07-09 MED ORDER — METHYLPHENIDATE HCL ER (OSM) 18 MG PO TBCR: 18.0000 mg | EXTENDED_RELEASE_TABLET | Freq: Every day | ORAL | 0 refills | Status: DC

## 2022-07-09 NOTE — Progress Notes (Signed)
   Virtual Visit via Video   I connected with patient on 07/09/22 at  2:20 PM EDT by a video enabled telemedicine application and verified that I am speaking with the correct person using two identifiers.  Location patient: Home Location provider: [facility] Persons participating in the virtual visit: Patient, Provider, CMA Sheryle Hail C)  I discussed the limitations of evaluation and management by telemedicine and the availability of in person appointments. The patient expressed understanding and agreed to proceed.  Subjective:   HPI:   ADHD- pt reports feeling overwhelmed.  Having a hard time getting things done and getting organized.  Pt was previously on Adderall which helped but caused her to feel 'jacked up'.  Developed palpitations.  ROS:   See pertinent positives and negatives per HPI.  Patient Active Problem List   Diagnosis Date Noted   Vitamin D deficiency 10/23/2020   Generalized anxiety disorder 05/15/2016   Irregular menses 05/15/2016   FH: colon cancer 02/25/2016   Anemia 11/13/2015   Attention deficit hyperactivity disorder (ADHD) 10/25/2015   Breast cancer screening 10/25/2015   Encounter for preventive health examination 10/25/2015   Obesity (BMI 30.0-34.9) 10/25/2015    Social History   Tobacco Use   Smoking status: Never   Smokeless tobacco: Never  Substance Use Topics   Alcohol use: No    Current Outpatient Medications:    ALPRAZolam (XANAX) 1 MG tablet, Take 1 mg by mouth once., Disp: , Rfl:    cholecalciferol (VITAMIN D) 1000 units tablet, Take 1,000 Units by mouth daily., Disp: , Rfl:    Cyanocobalamin (VITAMIN B 12 PO), Take by mouth., Disp: , Rfl:    escitalopram (LEXAPRO) 20 MG tablet, Take 20 mg by mouth daily., Disp: , Rfl:    fluticasone (FLONASE) 50 MCG/ACT nasal spray, Place 1 spray into both nostrils daily., Disp: 16 g, Rfl: 6   citalopram (CELEXA) 20 MG tablet, Celexa 20 mg tablet  Take 1 tablet every day by oral route.  (Patient not taking: Reported on 07/09/2022), Disp: , Rfl:    meloxicam (MOBIC) 15 MG tablet, Take 1 tablet (15 mg total) by mouth daily. (Patient not taking: Reported on 07/09/2022), Disp: 30 tablet, Rfl: 1  No Known Allergies  Objective:   There were no vitals taken for this visit. AAOx3, NAD NCAT, EOMI No obvious CN deficits Coloring WNL Pt is able to speak clearly, coherently without shortness of breath or increased work of breathing.  Thought process is linear.  Mood is appropriate.   Assessment and Plan:   ADHD- deteriorated.  Pt stopped her Adderall b/c she was feeling 'jacked up' and having palpitations.  She knows that she needs to take something for her ADHD b/c she is overwhelmed, disorganized, and having a hard time w/ task completion.  Since Adderall improved sxs but caused side effects, will try low dose Concerta 18mg  daily.  She is aware we may need to titrate and adjust.  Will follow.   , MD 07/09/2022

## 2022-07-22 ENCOUNTER — Telehealth: Payer: Self-pay | Admitting: Family Medicine

## 2022-07-22 NOTE — Telephone Encounter (Signed)
Caller name:Marlaine Calia   On DPR? :yes/no: Yes  Call back number: [phone]  Provider they see: Beverely Low   Reason for call: Pt called stating that her pharmacy is still wait for a response from our office about patient medication methylphenidate 18 mg. Please Advise

## 2022-07-22 NOTE — Telephone Encounter (Signed)
Called the pharmacy and we need to obtain  a PA for medication , Pharmacy is faxing that over . Pt is aware

## 2022-07-30 ENCOUNTER — Encounter: Payer: Self-pay | Admitting: Family Medicine

## 2022-08-12 ENCOUNTER — Encounter: Payer: Self-pay | Admitting: Family Medicine

## 2022-08-12 ENCOUNTER — Telehealth (INDEPENDENT_AMBULATORY_CARE_PROVIDER_SITE_OTHER): Payer: BC Managed Care – PPO | Admitting: Family Medicine

## 2022-08-12 VITALS — Temp 101.0°F

## 2022-08-12 DIAGNOSIS — U071 COVID-19: Secondary | ICD-10-CM

## 2022-08-12 MED ORDER — GUAIFENESIN-CODEINE 100-10 MG/5ML PO SYRP: 10.0000 mL | ORAL_SOLUTION | Freq: Three times a day (TID) | ORAL | 0 refills | Status: DC | PRN

## 2022-08-12 NOTE — Progress Notes (Signed)
   Virtual Visit via Video   I connected with patient on 08/12/22 at 11:00 AM EDT by a video enabled telemedicine application and verified that I am speaking with the correct person using two identifiers.  Location patient: Home Location provider: [facility] Persons participating in the virtual visit: Patient, Provider, [place] Marcille Blanco C)  I discussed the limitations of evaluation and management by telemedicine and the availability of in person appointments. The patient expressed understanding and agreed to proceed.  Subjective:   HPI:  Fever- 'i probably have COVID' despite a negative COVID test yesterday.  Fever to 101, dry cough, sinus pressure, fatigue, diarrhea.  +body aches.  Some skin sensitivity.  Had severe sore throat yesterday but this improved today.  Pt woke yesterday w/ fever.  + sick contacts w/ similar sxs.  ROS:   See pertinent positives and negatives per HPI.  Patient Active Problem List   Diagnosis Date Noted   Vitamin D deficiency 10/23/2020   Generalized anxiety disorder 05/15/2016   Irregular menses 05/15/2016   FH: colon cancer 02/25/2016   Anemia 11/13/2015   Attention deficit hyperactivity disorder (ADHD) 10/25/2015   Breast cancer screening 10/25/2015   Encounter for preventive health examination 10/25/2015   Obesity (BMI 30.0-34.9) 10/25/2015    Social History   Tobacco Use   Smoking status: Never   Smokeless tobacco: Never  Substance Use Topics   Alcohol use: No    Current Outpatient Medications:    cholecalciferol (VITAMIN D) 1000 units tablet, Take 1,000 Units by mouth daily., Disp: , Rfl:    Cyanocobalamin (VITAMIN B 12 PO), Take by mouth., Disp: , Rfl:    escitalopram (LEXAPRO) 20 MG tablet, Take 20 mg by mouth daily., Disp: , Rfl:    fluticasone (FLONASE) 50 MCG/ACT nasal spray, Place 1 spray into both nostrils daily., Disp: 16 g, Rfl: 6   ALPRAZolam (XANAX) 1 MG tablet, Take 1 mg by mouth once. (Patient not taking:  Reported on 08/12/2022), Disp: , Rfl:    citalopram (CELEXA) 20 MG tablet, Celexa 20 mg tablet  Take 1 tablet every day by oral route. (Patient not taking: Reported on 08/12/2022), Disp: , Rfl:    meloxicam (MOBIC) 15 MG tablet, Take 1 tablet (15 mg total) by mouth daily. (Patient not taking: Reported on 08/12/2022), Disp: 30 tablet, Rfl: 1   methylphenidate (CONCERTA) 18 MG PO CR tablet, Take 1 tablet (18 mg total) by mouth daily. (Patient not taking: Reported on 08/12/2022), Disp: 30 tablet, Rfl: 0  No Known Allergies  Objective:   Temp (!) 101 F (38.3 C) (Oral)  AAOx3, NAD NCAT, EOMI No obvious CN deficits Coloring WNL Pt is able to speak clearly, coherently without shortness of breath or increased work of breathing.  + dry cough Thought process is linear.  Mood is appropriate.   Assessment and Plan:   Suspected COVID- pt's sxs started yesterday so it was likely too early for her to test.  She has all the symptoms that would make COVID the most likely dx and sick contacts.  She is not interested in antiviral medication at this time so we discussed supportive care- tylenol/ibuprofen, fluids, rest, Vit D, Vit C, and Zinc, cough meds prn.  Reviewed red flags that should prompt attention.  Pt expressed understanding and is in agreement w/ plan.    Annye Asa, MD 08/12/2022

## 2022-08-21 ENCOUNTER — Encounter: Payer: Self-pay | Admitting: Family Medicine

## 2022-08-21 ENCOUNTER — Telehealth: Payer: BC Managed Care – PPO | Admitting: Family Medicine

## 2022-08-21 VITALS — Ht 68.0 in | Wt 209.4 lb

## 2022-08-21 DIAGNOSIS — U071 COVID-19: Secondary | ICD-10-CM

## 2022-08-21 DIAGNOSIS — J0100 Acute maxillary sinusitis, unspecified: Secondary | ICD-10-CM | POA: Diagnosis not present

## 2022-08-21 MED ORDER — AMOXICILLIN-POT CLAVULANATE 875-125 MG PO TABS: 1.0000 | ORAL_TABLET | Freq: Two times a day (BID) | ORAL | 0 refills | Status: DC

## 2022-08-21 NOTE — Progress Notes (Signed)
Virtual Visit via Video Note  I connected with Jenny Little on 08/21/22 at 3:52 PM by a video enabled telemedicine application and verified that I am speaking with the correct person using two identifiers.  Patient location: home by self.  My location: office - [employer].    I discussed the limitations, risks, security and privacy concerns of performing an evaluation and management service by telephone and the availability of in person appointments. I also discussed with the patient that there may be a patient responsible charge related to this service. The patient expressed understanding and agreed to proceed, consent obtained  Chief complaint:  Chief Complaint  Patient presents with   Sinus Problem    Pt states she thinks she has a sinus infection, she has face pressure headache, teeth ache feels weak , has been going on for a couple of weeks pt states she has been taking allerga D, nasal spray     History of Present Illness: Jenny Little is a 46 y.o. female  Sinus congestion: Started 2 weeks ago. Sinus congestion that preceded Covid infection last week  (positive test 9 days ago, fever lethargy, HA, cough). Those sx's improved, but persistent sinus congestion pain, discolored nasal d/c. Unrelieved with allegra D, saline NS. Upper teeth hurt, persistent HA, no fever.    Patient Active Problem List   Diagnosis Date Noted   Vitamin D deficiency 10/23/2020   Generalized anxiety disorder 05/15/2016   Irregular menses 05/15/2016   FH: colon cancer 02/25/2016   Anemia 11/13/2015   Attention deficit hyperactivity disorder (ADHD) 10/25/2015   Breast cancer screening 10/25/2015   Encounter for preventive health examination 10/25/2015   Obesity (BMI 30.0-34.9) 10/25/2015   Past Medical History:  Diagnosis Date   ADHD (attention deficit hyperactivity disorder)    Allergy    Anxiety    Depression    Fracture, toe    Iron deficiency anemia    Meningitis spinal     bacterial-child   Migraine    Past Surgical History:  Procedure Laterality Date   BREAST SURGERY  2002   implants (saline)   No Known Allergies Prior to Admission medications   Medication Sig Start Date End Date Taking? Authorizing Provider  cholecalciferol (VITAMIN D) 1000 units tablet Take 1,000 Units by mouth daily.   Yes [provider]  Cyanocobalamin (VITAMIN B 12 PO) Take by mouth.   Yes [provider]  escitalopram (LEXAPRO) 20 MG tablet Take 20 mg by mouth daily. 06/13/22  Yes [provider]  fluticasone (FLONASE) 50 MCG/ACT nasal spray Place 1 spray into both nostrils daily. 01/19/18  Yes Willow Ora, MD  ALPRAZolam Prudy Feeler) 1 MG tablet Take 1 mg by mouth once. Patient not taking: Reported on 08/12/2022 06/13/22   [provider]  citalopram (CELEXA) 20 MG tablet Celexa 20 mg tablet  Take 1 tablet every day by oral route. Patient not taking: Reported on 08/12/2022    [provider]  guaiFENesin-codeine (ROBITUSSIN AC) 100-10 MG/5ML syrup Take 10 mLs by mouth 3 (three) times daily as needed for cough. Patient not taking: Reported on 08/21/2022 08/12/22   Sheliah Hatch, MD  meloxicam (MOBIC) 15 MG tablet Take 1 tablet (15 mg total) by mouth daily. Patient not taking: Reported on 08/12/2022 01/24/22   Sheliah Hatch, MD  methylphenidate (CONCERTA) 18 MG PO CR tablet Take 1 tablet (18 mg total) by mouth daily. Patient not taking: Reported on 08/12/2022 07/09/22   Sheliah Hatch, MD  Social History   Socioeconomic History   Marital status: Married    Spouse name: Not on file   Number of children: 2   Years of education: Not on file   Highest education level: Not on file  Occupational History   Occupation: real estate agent  Tobacco Use   Smoking status: Never   Smokeless tobacco: Never  Vaping Use   Vaping Use: Never used  Substance and Sexual Activity   Alcohol use: No   Drug use: No   Sexual activity: Yes     Comment: female   Other Topics Concern   Not on file  Social History Narrative   Married. Husband's name Jenny Little. 2 children, Cristie Hem and Lynelle Smoke.   Bachelor's in social work. Employed full-time as a Forensic psychologist.   Moved to [facility] November 2016.   Takes a daily vitamin, wears her seatbelt, wears a bike helmet, exercises at least 3 times a week.   Smoke detector located in the home.   No firearms in the home.   Patient feels safe in her relationship.   Social Determinants of Health   Financial Resource Strain: Not on file  Food Insecurity: Not on file  Transportation Needs: Not on file  Physical Activity: Not on file  Stress: Not on file  Social Connections: Not on file  Intimate Partner Violence: Not on file    Observations/Objective: There were no vitals filed for this visit., Nontoxic appearance on video.  Normal respiratory effort, no distress, no audible wheeze or stridor on exam.  Appropriate responses, coherent responses.  Euthymic mood.  All questions answered with understanding of plan expressed.  Assessment and Plan: Acute maxillary sinusitis, recurrence not specified - Plan: amoxicillin-clavulanate (AUGMENTIN) 875-125 MG tablet  COVID-19 virus infection Suspect 2 separate conditions with preceding sinus symptoms, COVID infection that improved with persistent now worsening sinus symptoms, suspected bacterial sinusitis, maxillary.  Less likely rebound/worsening COVID.  -Start Augmentin for sinus infection, continue saline nasal spray, fluids, other symptomatic care per handout.  -COVID testing discussed, masking if persistent positive and recheck testing at 48 hours.  Once negative, less contagious.  -RTC/urgent care/ER precautions  Follow Up Instructions: As above.  Follow-up as needed.   I discussed the assessment and treatment plan with the patient. The patient was provided an opportunity to ask questions and all were answered. The patient agreed with the plan and  demonstrated an understanding of the instructions.   The patient was advised to call back or seek an in-person evaluation if the symptoms worsen or if the condition fails to improve as anticipated.   Wendie Agreste, MD

## 2022-08-21 NOTE — Patient Instructions (Addendum)
Repeat covid test and I do recommend mask for a few more days until negative or improving again.  Continue saline nasal spray, drink plenty of fluids.  Start Augmentin for sinus infection.  See other information below.  I expect you to be getting better soon, but please let us know if there are questions.  Take care! Sinus Infection, Adult A sinus infection, also called sinusitis, is inflammation of your sinuses. Sinuses are hollow spaces in the bones around your face. Your sinuses are located: Around your eyes. In the middle of your forehead. Behind your nose. In your cheekbones. Mucus normally drains out of your sinuses. When your nasal tissues become inflamed or swollen, mucus can become trapped or blocked. This allows bacteria, viruses, and fungi to grow, which leads to infection. Most infections of the sinuses are caused by a virus. A sinus infection can develop quickly. It can last for up to 4 weeks (acute) or for more than 12 weeks (chronic). A sinus infection often develops after a cold. What are the causes? This condition is caused by anything that creates swelling in the sinuses or stops mucus from draining. This includes: Allergies. Asthma. Infection from bacteria or viruses. Deformities or blockages in your nose or sinuses. Abnormal growths in the nose (nasal polyps). Pollutants, such as chemicals or irritants in the air. Infection from fungi. This is rare. What increases the risk? You are more likely to develop this condition if you: Have a weak body defense system (immune system). Do a lot of swimming or diving. Overuse nasal sprays. Smoke. What are the signs or symptoms? The main symptoms of this condition are pain and a feeling of pressure around the affected sinuses. Other symptoms include: Stuffy nose or congestion that makes it difficult to breathe through your nose. Thick yellow or greenish drainage from your nose. Tenderness, swelling, and warmth over the affected  sinuses. A cough that may get worse at night. Decreased sense of smell and taste. Extra mucus that collects in the throat or the back of the nose (postnasal drip) causing a sore throat or bad breath. Tiredness (fatigue). Fever. How is this diagnosed? This condition is diagnosed based on: Your symptoms. Your medical history. A physical exam. Tests to find out if your condition is acute or chronic. This may include: Checking your nose for nasal polyps. Viewing your sinuses using a device that has a light (endoscope). Testing for allergies or bacteria. Imaging tests, such as an MRI or CT scan. In rare cases, a bone biopsy may be done to rule out more serious types of fungal sinus disease. How is this treated? Treatment for a sinus infection depends on the cause and whether your condition is chronic or acute. If caused by a virus, your symptoms should go away on their own within 10 days. You may be given medicines to relieve symptoms. They include: Medicines that shrink swollen nasal passages (decongestants). A spray that eases inflammation of the nostrils (topical intranasal corticosteroids). Rinses that help get rid of thick mucus in your nose (nasal saline washes). Medicines that treat allergies (antihistamines). Over-the-counter pain relievers. If caused by bacteria, your health care provider may recommend waiting to see if your symptoms improve. Most bacterial infections will get better without antibiotic medicine. You may be given antibiotics if you have: A severe infection. A weak immune system. If caused by narrow nasal passages or nasal polyps, surgery may be needed. Follow these instructions at home: Medicines Take, use, or apply over-the-counter and prescription medicines  only as told by your health care provider. These may include nasal sprays. If you were prescribed an antibiotic medicine, take it as told by your health care provider. Do not stop taking the antibiotic even if  you start to feel better. Hydrate and humidify  Drink enough fluid to keep your urine pale yellow. Staying hydrated will help to thin your mucus. Use a cool mist humidifier to keep the humidity level in your home above 50%. Inhale steam for 10-15 minutes, 3-4 times a day, or as told by your health care provider. You can do this in the bathroom while a hot shower is running. Limit your exposure to cool or dry air. Rest Rest as much as possible. Sleep with your head raised (elevated). Make sure you get enough sleep each night. General instructions  Apply a warm, moist washcloth to your face 3-4 times a day or as told by your health care provider. This will help with discomfort. Use nasal saline washes as often as told by your health care provider. Wash your hands often with soap and water to reduce your exposure to germs. If soap and water are not available, use hand sanitizer. Do not smoke. Avoid being around people who are smoking (secondhand smoke). Keep all follow-up visits. This is important. Contact a health care provider if: You have a fever. Your symptoms get worse. Your symptoms do not improve within 10 days. Get help right away if: You have a severe headache. You have persistent vomiting. You have severe pain or swelling around your face or eyes. You have vision problems. You develop confusion. Your neck is stiff. You have trouble breathing. These symptoms may be an emergency. Get help right away. Call 911. Do not wait to see if the symptoms will go away. Do not drive yourself to the hospital. Summary A sinus infection is soreness and inflammation of your sinuses. Sinuses are hollow spaces in the bones around your face. This condition is caused by nasal tissues that become inflamed or swollen. The swelling traps or blocks the flow of mucus. This allows bacteria, viruses, and fungi to grow, which leads to infection. If you were prescribed an antibiotic medicine, take it as  told by your health care provider. Do not stop taking the antibiotic even if you start to feel better. Keep all follow-up visits. This is important. This information is not intended to replace advice given to you by your health care provider. Make sure you discuss any questions you have with your health care provider. Document Revised: 10/15/2021 Document Reviewed: 10/15/2021 Elsevier Patient Education  2023 [employer].

## 2022-08-27 ENCOUNTER — Ambulatory Visit (AMBULATORY_SURGERY_CENTER): Payer: Self-pay

## 2022-08-27 VITALS — Ht 68.0 in | Wt 207.0 lb

## 2022-08-27 DIAGNOSIS — Z1211 Encounter for screening for malignant neoplasm of colon: Secondary | ICD-10-CM

## 2022-08-27 MED ORDER — NA SULFATE-K SULFATE-MG SULF 17.5-3.13-1.6 GM/177ML PO SOLN: 1.0000 | Freq: Once | ORAL | 0 refills | Status: AC

## 2022-08-27 NOTE — Progress Notes (Signed)
No egg or soy allergy known to patient  No issues known to pt with past sedation with any surgeries or procedures Patient denies ever being told they had issues or difficulty with intubation  No FH of Malignant Hyperthermia Pt is not on diet pills Pt is not on  home 02  Pt is not on blood thinners  Pt denies issues with constipation  No A fib or A flutter Have any cardiac testing pending--no Pt instructed to use [email or web address] or GoodRx for a price reduction on prep   

## 2022-09-03 ENCOUNTER — Encounter: Payer: Self-pay | Admitting: Internal Medicine

## 2022-09-11 NOTE — Progress Notes (Signed)
[facility] History and Physical   Primary Care Physician:  Sheliah Hatch, MD   Reason for Procedure:  Colon cancer screening  Plan:    Colonoscopy     HPI: Jenny Little is a 46 y.o. female presenting for screening examination as above.   Past Medical History:  Diagnosis Date   ADHD (attention deficit hyperactivity disorder)    Allergy    Anxiety    Depression    Fracture, toe    Iron deficiency anemia    Meningitis spinal    bacterial-child   Migraine     Past Surgical History:  Procedure Laterality Date   BREAST SURGERY  11/24/2000   implants (saline)    Prior to Admission medications   Medication Sig Start Date End Date Taking? Authorizing Provider  amoxicillin-clavulanate (AUGMENTIN) 875-125 MG tablet Take 1 tablet by mouth 2 (two) times daily. 08/21/22   Shade Flood, MD  cholecalciferol (VITAMIN D) 1000 units tablet Take 1,000 Units by mouth daily.    [provider]  citalopram (CELEXA) 20 MG tablet Celexa 20 mg tablet  Take 1 tablet every day by oral route. Patient not taking: Reported on 08/12/2022    [provider]  Cyanocobalamin (VITAMIN B 12 PO) Take by mouth.    [provider]  escitalopram (LEXAPRO) 20 MG tablet Take 20 mg by mouth daily. 06/13/22   [provider]  fluticasone (FLONASE) 50 MCG/ACT nasal spray Place 1 spray into both nostrils daily. 01/19/18   Willow Ora, MD  methylphenidate (CONCERTA) 18 MG PO CR tablet Take 1 tablet (18 mg total) by mouth daily. Patient not taking: Reported on 08/12/2022 07/09/22   Sheliah Hatch, MD    Current Outpatient Medications  Medication Sig Dispense Refill   amoxicillin-clavulanate (AUGMENTIN) 875-125 MG tablet Take 1 tablet by mouth 2 (two) times daily. 20 tablet 0   cholecalciferol (VITAMIN D) 1000 units tablet Take 1,000 Units by mouth daily.     citalopram (CELEXA) 20 MG tablet Celexa 20 mg tablet  Take 1 tablet every day by oral  route. (Patient not taking: Reported on 08/12/2022)     Cyanocobalamin (VITAMIN B 12 PO) Take by mouth.     escitalopram (LEXAPRO) 20 MG tablet Take 20 mg by mouth daily.     fluticasone (FLONASE) 50 MCG/ACT nasal spray Place 1 spray into both nostrils daily. 16 g 6   methylphenidate (CONCERTA) 18 MG PO CR tablet Take 1 tablet (18 mg total) by mouth daily. (Patient not taking: Reported on 08/12/2022) 30 tablet 0   Current Facility-Administered Medications  Medication Dose Route Frequency Provider Last Rate Last Admin   0.9 %  sodium chloride infusion  500 mL Intravenous Once Iva Boop, MD        Allergies as of 09/12/2022   (No Known Allergies)    Family History  Problem Relation Age of Onset   Colon polyps Mother        x many, non-cancerous   Heart disease Father    Diabetes Father    Mental illness Father        depression   AAA (abdominal aortic aneurysm) Father 81       died    Hypertension Brother    Early death Paternal Uncle    Heart disease Paternal Uncle    Colon cancer Maternal Grandmother 40       died of colon cancer   Early death Maternal Grandmother    Heart  disease Maternal Grandfather    Stroke Paternal Grandfather    Esophageal cancer Neg Hx    Rectal cancer Neg Hx    Stomach cancer Neg Hx     Social History   Socioeconomic History   Marital status: Married    Spouse name: Not on file   Number of children: 2   Years of education: Not on file   Highest education level: Not on file  Occupational History   Occupation: real estate agent  Tobacco Use   Smoking status: Never   Smokeless tobacco: Never  Vaping Use   Vaping Use: Never used  Substance and Sexual Activity   Alcohol use: No   Drug use: Never   Sexual activity: Yes    Comment: female   Other Topics Concern   Not on file  Social History Narrative   Married. Husband's name [name]. 2 children, Cristie Hem and Lynelle Smoke.   Bachelor's in social work. Employed full-time as a Forensic psychologist.    Moved to [facility] November 2016.   Takes a daily vitamin, wears her seatbelt, wears a bike helmet, exercises at least 3 times a week.   Smoke detector located in the home.   No firearms in the home.   Patient feels safe in her relationship.   Social Determinants of Health   Financial Resource Strain: Not on file  Food Insecurity: Not on file  Transportation Needs: Not on file  Physical Activity: Not on file  Stress: Not on file  Social Connections: Not on file  Intimate Partner Violence: Not on file    Review of Systems:  All other review of systems negative except as mentioned in the HPI.  Physical Exam: Vital signs BP 98/64   Pulse 68   Temp (!) 97.5 F (36.4 C) (Temporal)   Ht 5\' 8"  (1.727 m)   Wt 207 lb (93.9 kg)   LMP 09/08/2022   SpO2 96%   BMI 31.47 kg/m   General:   Alert,  Well-developed, well-nourished, pleasant and cooperative in NAD Lungs:  Clear throughout to auscultation.   Heart:  Regular rate and rhythm; no murmurs, clicks, rubs,  or gallops. Abdomen:  Soft, nontender and nondistended. Normal bowel sounds.   Neuro/Psych:  Alert and cooperative. Normal mood and affect. A and O x 3   @Ethelyne Erich  Simonne Maffucci, MD, [facility] [phone] (pager) 09/12/2022 9:10 AM@

## 2022-09-12 ENCOUNTER — Encounter: Payer: Self-pay | Admitting: Internal Medicine

## 2022-09-12 ENCOUNTER — Ambulatory Visit (AMBULATORY_SURGERY_CENTER): Payer: BC Managed Care – PPO | Admitting: Internal Medicine

## 2022-09-12 VITALS — BP 103/76 | HR 70 | Temp 97.5°F | Resp 14 | Ht 68.0 in | Wt 207.0 lb

## 2022-09-12 DIAGNOSIS — Z1211 Encounter for screening for malignant neoplasm of colon: Secondary | ICD-10-CM | POA: Diagnosis not present

## 2022-09-12 MED ORDER — SODIUM CHLORIDE 0.9 % IV SOLN: 500.0000 mL | Freq: Once | INTRAVENOUS | Status: DC

## 2022-09-12 NOTE — Patient Instructions (Addendum)
Colonoscopy was normal - no polyps or cancer were seen.  I appreciate the opportunity to care for you. Gatha Mayer, MD, FACG   YOU HAD AN ENDOSCOPIC PROCEDURE TODAY AT [facility]:   Refer to the procedure report that was given to you for any specific questions about what was found during the examination.  If the procedure report does not answer your questions, please call your gastroenterologist to clarify.  If you requested that your care partner not be given the details of your procedure findings, then the procedure report has been included in a sealed envelope for you to review at your convenience later.  YOU SHOULD EXPECT: Some feelings of bloating in the abdomen. Passage of more gas than usual.  Walking can help get rid of the air that was put into your GI tract during the procedure and reduce the bloating. If you had a lower endoscopy (such as a colonoscopy or flexible sigmoidoscopy) you may notice spotting of blood in your stool or on the toilet paper. If you underwent a bowel prep for your procedure, you may not have a normal bowel movement for a few days.  Please Note:  You might notice some irritation and congestion in your nose or some drainage.  This is from the oxygen used during your procedure.  There is no need for concern and it should clear up in a day or so.  SYMPTOMS TO REPORT IMMEDIATELY:  Following lower endoscopy (colonoscopy or flexible sigmoidoscopy):  Excessive amounts of blood in the stool  Significant tenderness or worsening of abdominal pains  Swelling of the abdomen that is new, acute  Fever of 100F or higher  For urgent or emergent issues, a gastroenterologist can be reached at any hour by calling [phone]. Do not use MyChart messaging for urgent concerns.    DIET:  We do recommend a small meal at first, but then you may proceed to your regular diet.  Drink plenty of fluids but you should avoid alcoholic beverages for 24  hours.  ACTIVITY:  You should plan to take it easy for the rest of today and you should NOT DRIVE or use heavy machinery until tomorrow (because of the sedation medicines used during the test).    FOLLOW UP: Our staff will call the number listed on your records the next business day following your procedure.  We will call around 7:15- 8:00 am to check on you and address any questions or concerns that you may have regarding the information given to you following your procedure. If we do not reach you, we will leave a message.     If any biopsies were taken you will be contacted by phone or by letter within the next 1-3 weeks.  Please call us at [phone] if you have not heard about the biopsies in 3 weeks.    SIGNATURES/CONFIDENTIALITY: You and/or your care partner have signed paperwork which will be entered into your electronic medical record.  These signatures attest to the fact that that the information above on your After Visit Summary has been reviewed and is understood.  Full responsibility of the confidentiality of this discharge information lies with you and/or your care-partner.

## 2022-09-12 NOTE — Op Note (Signed)
[facility] Patient Name: Jenny Little Procedure Date: 09/12/2022 9:05 AM MRN: [id number] Endoscopist: Iva Boop , MD Age: 46 Referring MD:  Date of Birth: 1976-05-30 Gender: Female Account #: [id number] Procedure:                Colonoscopy Indications:              Screening for colorectal malignant neoplasm Medicines:                Monitored Anesthesia Care Procedure:                Pre-Anesthesia Assessment:                           - Prior to the procedure, a History and Physical                            was performed, and patient medications and                            allergies were reviewed. The patient's tolerance of                            previous anesthesia was also reviewed. The risks                            and benefits of the procedure and the sedation                            options and risks were discussed with the patient.                            All questions were answered, and informed consent                            was obtained. Prior Anticoagulants: The patient has                            taken no previous anticoagulant or antiplatelet                            agents. ASA Grade Assessment: II - A patient with                            mild systemic disease. After reviewing the risks                            and benefits, the patient was deemed in                            satisfactory condition to undergo the procedure.                           After obtaining informed consent, the colonoscope  was passed under direct vision. Throughout the                            procedure, the patient's blood pressure, pulse, and                            oxygen saturations were monitored continuously. The                            Colonoscope was introduced through the anus and                            advanced to the the cecum, identified by                            appendiceal orifice and  ileocecal valve. The                            colonoscopy was performed without difficulty. The                            patient tolerated the procedure well. The quality                            of the bowel preparation was good. The bowel                            preparation used was SUPREP via split dose                            instruction. The ileocecal valve, appendiceal                            orifice, and rectum were photographed. Scope In: 9:19:05 AM Scope Out: 9:34:27 AM Scope Withdrawal Time: 0 hours 11 minutes 32 seconds  Total Procedure Duration: 0 hours 15 minutes 22 seconds  Findings:                 The perianal and digital rectal examinations were                            normal.                           The entire examined colon appeared normal on direct                            and retroflexion views. Complications:            No immediate complications. Estimated Blood Loss:     Estimated blood loss: none. Impression:               - The entire examined colon is normal on direct and                            retroflexion views.                           -  No specimens collected. Recommendation:           - Patient has a contact number available for                            emergencies. The signs and symptoms of potential                            delayed complications were discussed with the                            patient. Return to normal activities tomorrow.                            Written discharge instructions were provided to the                            patient.                           - Resume previous diet.                           - Continue present medications. Iva Boop, MD 09/12/2022 9:45:03 AM This report has been signed electronically.

## 2022-09-15 ENCOUNTER — Telehealth: Payer: Self-pay | Admitting: *Deleted

## 2022-09-15 NOTE — Telephone Encounter (Signed)
  Follow up Call-     09/12/2022    8:46 AM  Call back number  Post procedure Call Back phone  # [phone]  Permission to leave phone message Yes     Patient questions:  Do you have a fever, pain , or abdominal swelling? No. Pain Score  0 *  Have you tolerated food without any problems? Yes.    Have you been able to return to your normal activities? Yes.    Do you have any questions about your discharge instructions: Diet   No. Medications  No. Follow up visit  No.  Do you have questions or concerns about your Care? No.  Actions: * If pain score is 4 or above: No action needed, pain <4.

## 2022-09-20 IMAGING — DX DG KNEE COMPLETE 4+V*L*
4 series · 4 of 4 positions shown · non-contrast
Comparison: None

CLINICAL DATA: Chronic left knee pain.

EXAM:
LEFT KNEE - COMPLETE 4+ VIEW

[knee ap]
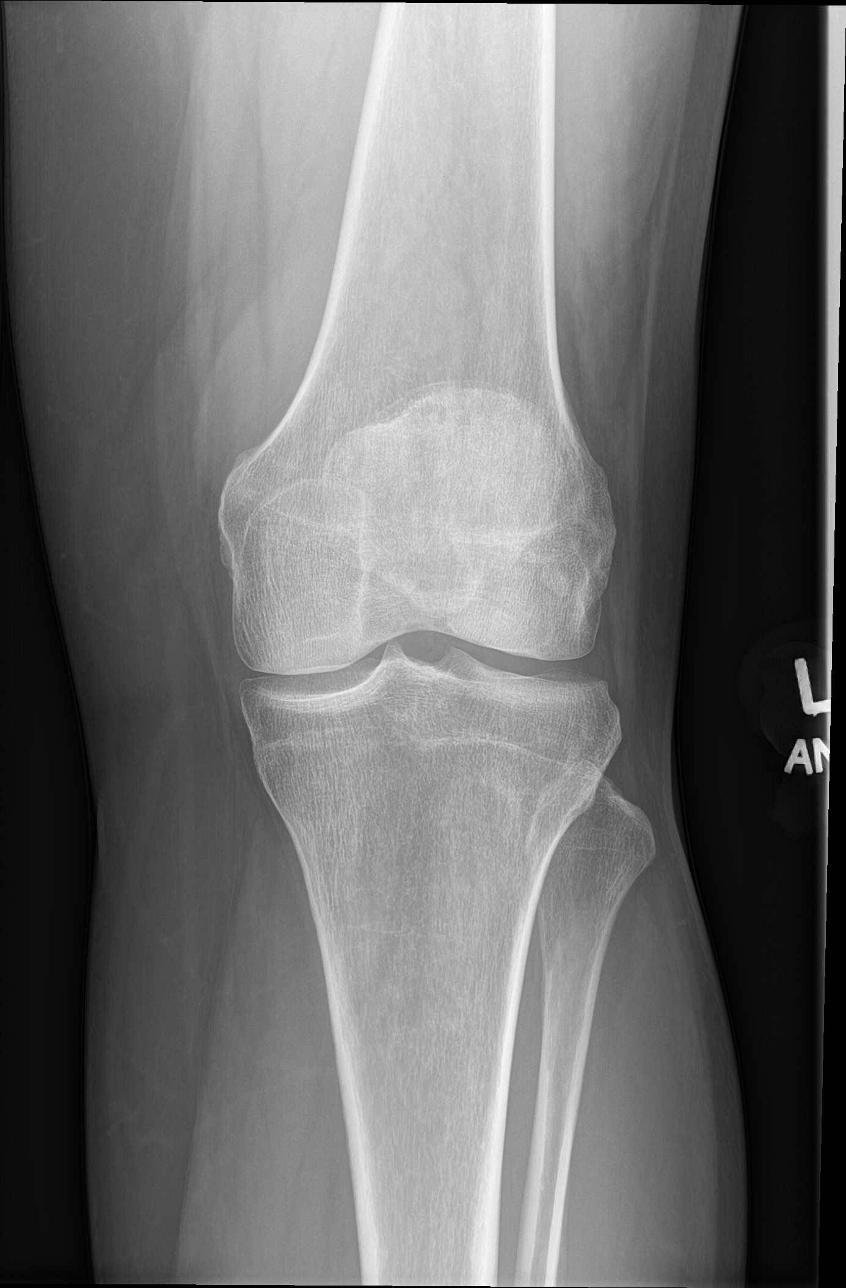

[knee lat]
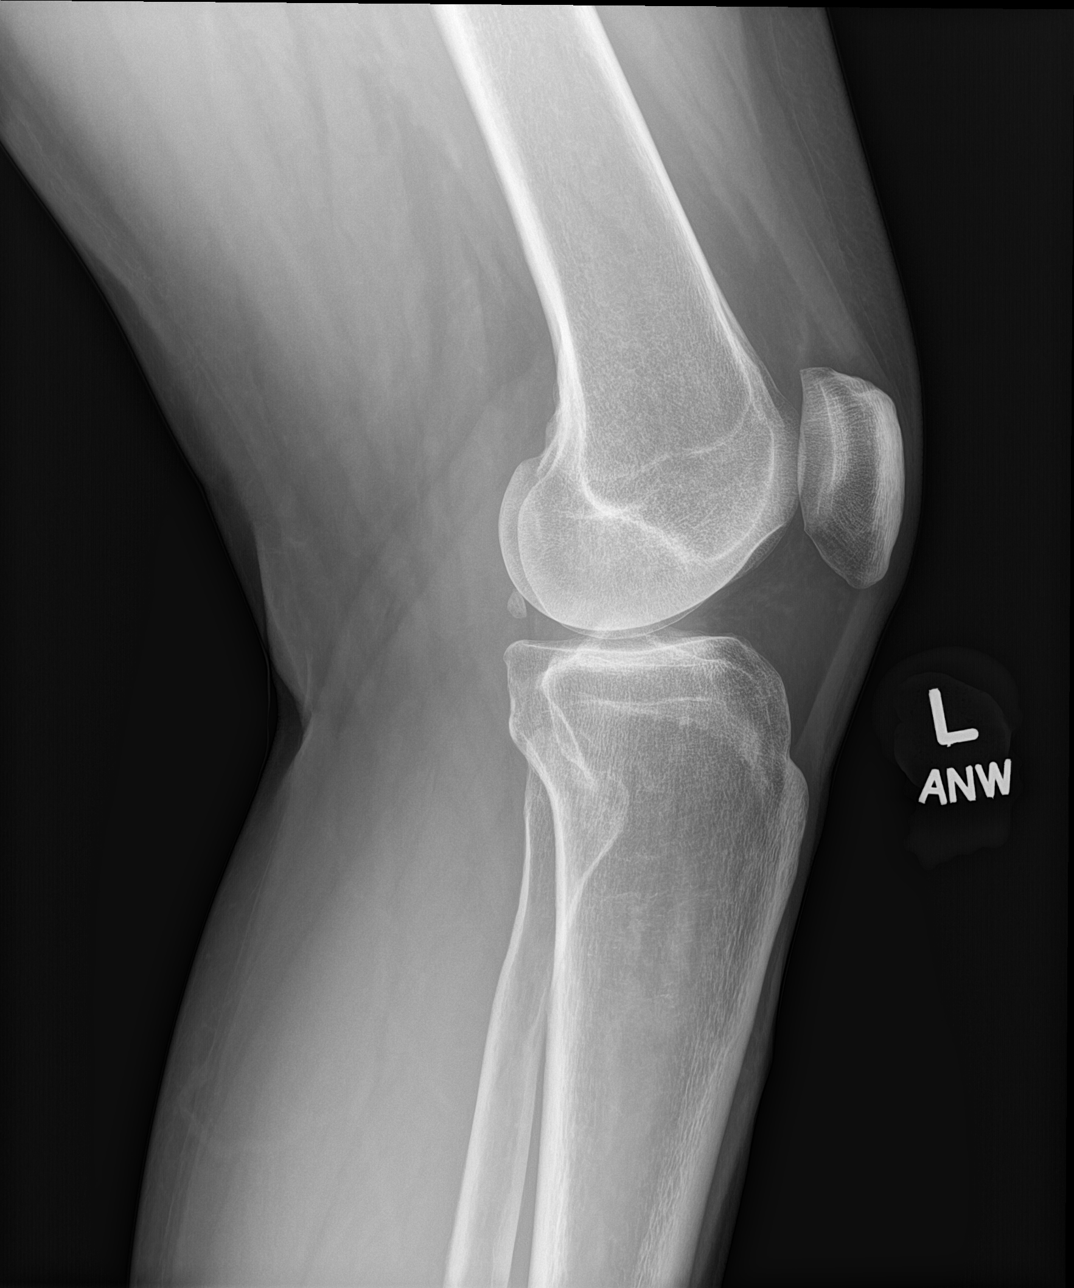

[patella skyline]
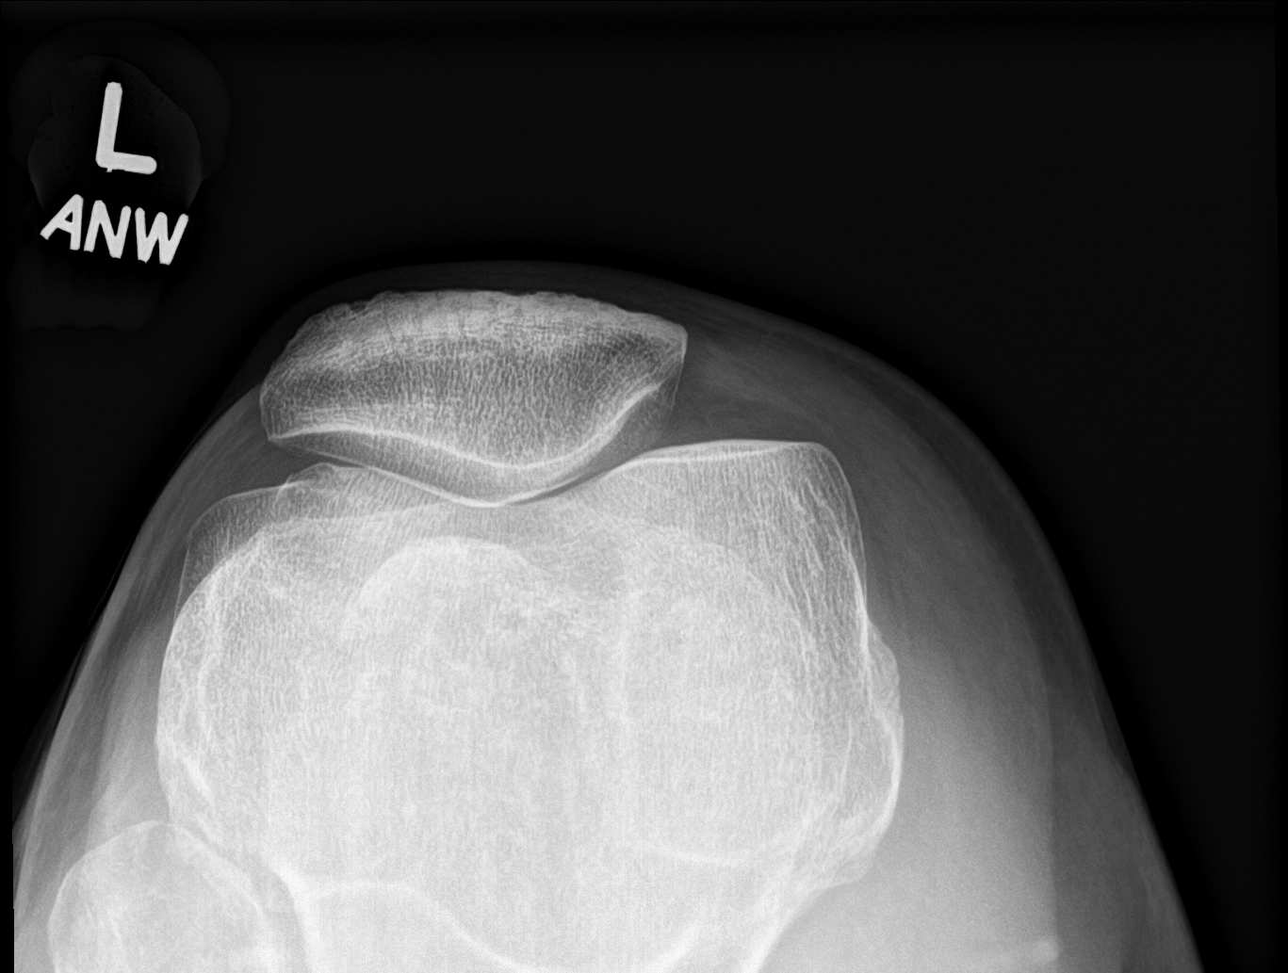

[knee tunnel]
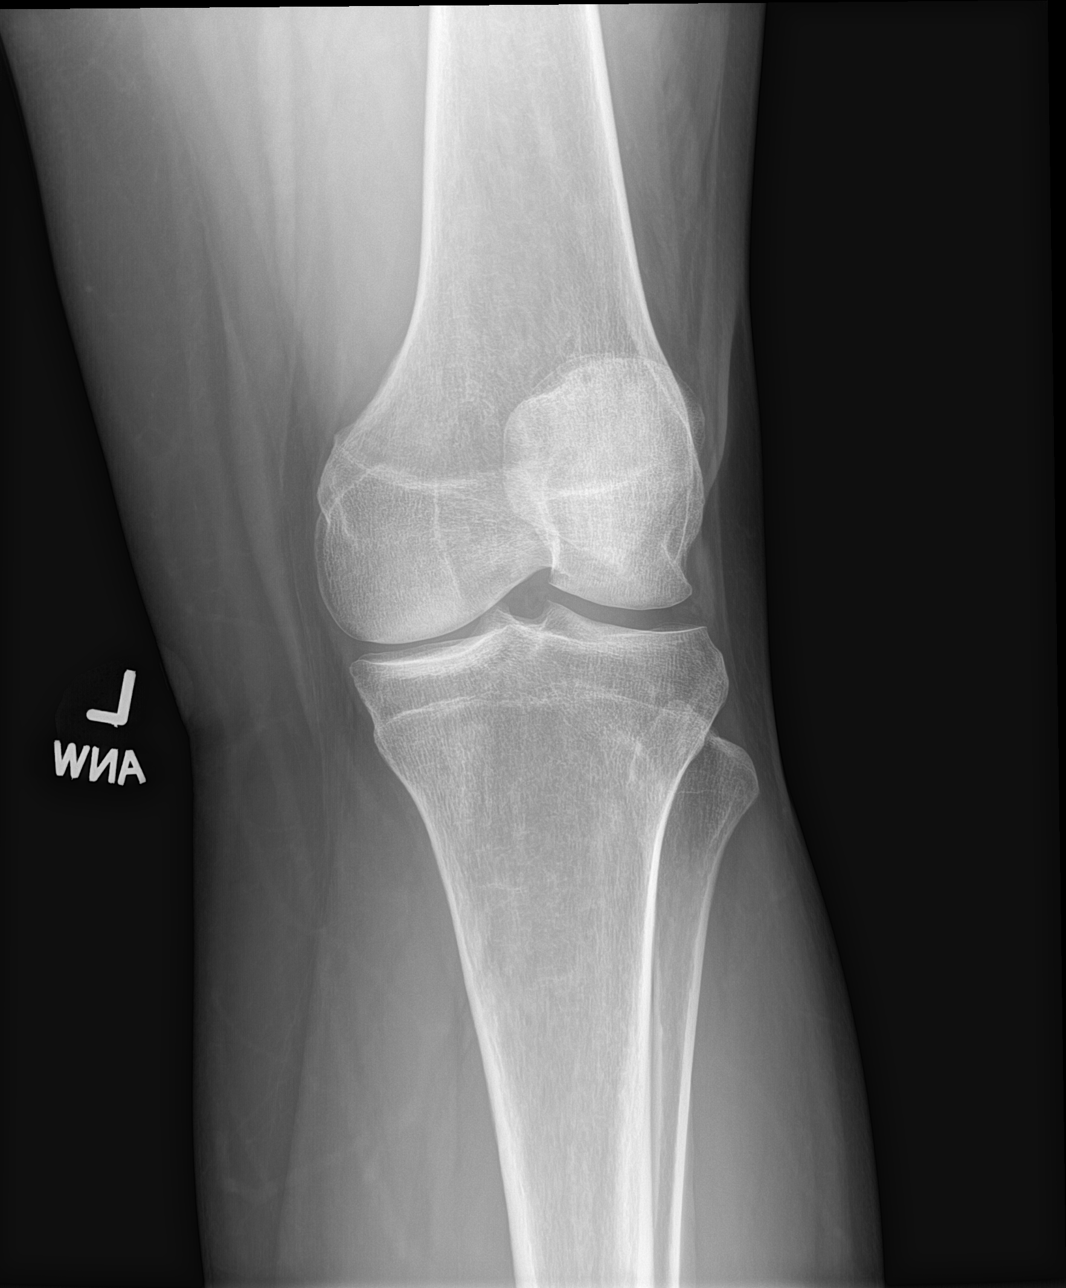

[4 of 4 positions shown; findings below may reference images not displayed]

FINDINGS: Mild medial compartment of the knee joint space narrowing. No joint
effusion. No acute fracture or dislocation.
IMPRESSION: Mild medial compartment degenerative joint space narrowing.

## 2022-11-25 ENCOUNTER — Telehealth (INDEPENDENT_AMBULATORY_CARE_PROVIDER_SITE_OTHER): Payer: Managed Care, Other (non HMO) | Admitting: Family

## 2022-11-25 ENCOUNTER — Encounter: Payer: Self-pay | Admitting: Family

## 2022-11-25 VITALS — Ht 68.0 in | Wt 125.0 lb

## 2022-11-25 DIAGNOSIS — B9689 Other specified bacterial agents as the cause of diseases classified elsewhere: Secondary | ICD-10-CM

## 2022-11-25 DIAGNOSIS — J3489 Other specified disorders of nose and nasal sinuses: Secondary | ICD-10-CM | POA: Diagnosis not present

## 2022-11-25 DIAGNOSIS — J329 Chronic sinusitis, unspecified: Secondary | ICD-10-CM | POA: Diagnosis not present

## 2022-11-25 MED ORDER — FLUTICASONE PROPIONATE 50 MCG/ACT NA SUSP: 2.0000 | Freq: Every day | NASAL | 6 refills | Status: AC

## 2022-11-25 MED ORDER — AMOXICILLIN 500 MG PO CAPS: 500.0000 mg | ORAL_CAPSULE | Freq: Three times a day (TID) | ORAL | 0 refills | Status: AC

## 2022-11-25 NOTE — Progress Notes (Signed)
Virtual Visit via Telephone Note  I connected with Jenny Little on 11/25/22 at  9:15 AM EST by telephone and verified that I am speaking with the correct person using two identifiers.  Location: Patient: Home Provider: [facility]   I discussed the limitations, risks, security and privacy concerns of performing an evaluation and management service by telephone and the availability of in person appointments. I also discussed with the patient that there may be a patient responsible charge related to this service. The patient expressed understanding and agreed to proceed.   History of Present Illness: 47 year old female presents via telephone with c/o congestion, sinus pressure that has been ongoing x 1 month but worsened over the weekend. She has left over Amoxil that she began taking 3 days ago, feels better but does not have enough medication to complete the treatment.     Observations/Objective: A&O, NAD, breathing non-labored, sounds congested    Assessment and Plan: Jenny Little was seen today for sinus problem.  Diagnoses and all orders for this visit:  Bacterial sinusitis  Sinus pressure  Other orders -     fluticasone (FLONASE) 50 MCG/ACT nasal spray; Place 2 sprays into both nostrils daily. -     amoxicillin (AMOXIL) 500 MG capsule; Take 1 capsule (500 mg total) by mouth 3 (three) times daily for 10 days.      Follow Up Instructions: Call the office if symptoms worsen or persist. Recheck as scheduled and sooner as needed.     I discussed the assessment and treatment plan with the patient. The patient was provided an opportunity to ask questions and all were answered. The patient agreed with the plan and demonstrated an understanding of the instructions.   The patient was advised to call back or seek an in-person evaluation if the symptoms worsen or if the condition fails to improve as anticipated.  I provided 20 minutes of non-face-to-face time during this  encounter.   Kennyth Arnold, FNP

## 2022-12-22 ENCOUNTER — Telehealth: Payer: Self-pay | Admitting: Family Medicine

## 2022-12-24 ENCOUNTER — Telehealth (INDEPENDENT_AMBULATORY_CARE_PROVIDER_SITE_OTHER): Payer: Managed Care, Other (non HMO) | Admitting: Family Medicine

## 2022-12-24 ENCOUNTER — Encounter: Payer: Self-pay | Admitting: Family Medicine

## 2022-12-24 DIAGNOSIS — U071 COVID-19: Secondary | ICD-10-CM | POA: Diagnosis not present

## 2022-12-24 MED ORDER — NIRMATRELVIR/RITONAVIR (PAXLOVID)TABLET: 3.0000 | ORAL_TABLET | Freq: Two times a day (BID) | ORAL | 0 refills | Status: AC

## 2022-12-24 NOTE — Progress Notes (Signed)
   Virtual Visit via Video   I connected with patient on 12/24/22 at  9:20 AM EST by a video enabled telemedicine application and verified that I am speaking with the correct person using two identifiers.  Location patient: Home Location provider: [facility] Persons participating in the virtual visit: Patient, Provider, Yonah Marcille Blanco C)  I discussed the limitations of evaluation and management by telemedicine and the availability of in person appointments. The patient expressed understanding and agreed to proceed.  Subjective:   HPI:   COVID- sxs started Friday w/ 'a really bad sore throat'.  Multiple sick contacts.  Woke up Saturday w/ fever, 'extreme congestion'.  Fever broke last night.  + HA.  'i feel like I have an ear infection'.  'lots of green snot'.  Drinking plenty of fluids.  Started taking leftover abx- Amoxicillin 500mg - has 7 days left.  ROS:   See pertinent positives and negatives per HPI.  Patient Active Problem List   Diagnosis Date Noted   Vitamin D deficiency 10/23/2020   Generalized anxiety disorder 05/15/2016   Irregular menses 05/15/2016   FH: colon cancer 02/25/2016   Anemia 11/13/2015   Attention deficit hyperactivity disorder (ADHD) 10/25/2015   Breast cancer screening 10/25/2015   Encounter for preventive health examination 10/25/2015   Obesity (BMI 30.0-34.9) 10/25/2015    Social History   Tobacco Use   Smoking status: Never   Smokeless tobacco: Never  Substance Use Topics   Alcohol use: No    Current Outpatient Medications:    Cyanocobalamin (VITAMIN B 12 PO), Take by mouth., Disp: , Rfl:    escitalopram (LEXAPRO) 20 MG tablet, Take 20 mg by mouth daily., Disp: , Rfl:    fluticasone (FLONASE) 50 MCG/ACT nasal spray, Place 2 sprays into both nostrils daily., Disp: 16 g, Rfl: 6   cholecalciferol (VITAMIN D) 1000 units tablet, Take 1,000 Units by mouth daily. (Patient not taking: Reported on 12/24/2022), Disp: , Rfl:     methylphenidate (CONCERTA) 18 MG PO CR tablet, Take 1 tablet (18 mg total) by mouth daily. (Patient not taking: Reported on 12/24/2022), Disp: 30 tablet, Rfl: 0  Current Facility-Administered Medications:    0.9 %  sodium chloride infusion, 500 mL, Intravenous, Once, Gatha Mayer, MD  No Known Allergies  Objective:   There were no vitals taken for this visit. AAOx3, NAD NCAT, EOMI No obvious CN deficits Pale Pt is able to speak clearly, coherently without shortness of breath or increased work of breathing but audible congestion Thought process is linear.  Mood is appropriate.   Assessment and Plan:   COVID- pt has hx of similar.  She is within the 5 day window so will start Paxlovid.  Reviewed her medications- none need to be held or adjusted.  Kidney function is excellent.  Reviewed supportive care and red flags that should prompt return.  Pt expressed understanding and is in agreement w/ plan.    Annye Asa, MD 12/24/2022

## 2023-01-02 ENCOUNTER — Encounter: Payer: Self-pay | Admitting: Family Medicine

## 2023-01-02 ENCOUNTER — Ambulatory Visit: Payer: Managed Care, Other (non HMO) | Admitting: Family Medicine

## 2023-01-02 VITALS — BP 90/64 | HR 74 | Temp 98.1°F | Ht 68.0 in | Wt 218.6 lb

## 2023-01-02 DIAGNOSIS — H6992 Unspecified Eustachian tube disorder, left ear: Secondary | ICD-10-CM | POA: Diagnosis not present

## 2023-01-02 NOTE — Progress Notes (Signed)
   Subjective:    Patient ID: Jenny Little, female    DOB: 13-May-1976, 47 y.o.   MRN: [id number]  HPI L ear fullness- pt recently had COVID and reports that while she feels better, she is still having L ear fullness and decreased hearing.  Pt finds it very annoying.  No drainage.   Review of Systems For ROS see HPI     Objective:   Physical Exam Vitals reviewed.  Constitutional:      General: She is not in acute distress.    Appearance: Normal appearance. She is not ill-appearing.  HENT:     Head: Normocephalic and atraumatic.     Right Ear: Tympanic membrane and ear canal normal.     Left Ear: Ear canal normal. Tympanic membrane is retracted.     Nose: Congestion present.     Right Sinus: No maxillary sinus tenderness or frontal sinus tenderness.     Left Sinus: No maxillary sinus tenderness or frontal sinus tenderness.  Eyes:     Extraocular Movements: Extraocular movements intact.     Conjunctiva/sclera: Conjunctivae normal.     Pupils: Pupils are equal, round, and reactive to light.  Skin:    General: Skin is warm and dry.  Neurological:     General: No focal deficit present.     Mental Status: She is alert and oriented to person, place, and time.  Psychiatric:        Mood and Affect: Mood normal.        Behavior: Behavior normal.        Thought Content: Thought content normal.           Assessment & Plan:   Eustachian Tube Dysfxn- new.  Reviewed dx w/ pt.  Restart nasal steroid and daily antihistamine.  Reviewed supportive care and red flags that should prompt return.  Pt expressed understanding and is in agreement w/ plan.

## 2023-01-02 NOTE — Telephone Encounter (Signed)
error 

## 2023-01-02 NOTE — Patient Instructions (Signed)
Follow up as needed or as scheduled RESTART the Flonase daily ADD a daily antihistamine like Claritin D or Allegra D (whichever is on sale) Drink LOTS of fluids REST!! Call with any questions or concerns Hang in there!!!

## 2023-01-15 ENCOUNTER — Telehealth: Payer: Self-pay | Admitting: Family Medicine

## 2023-01-15 MED ORDER — PREDNISONE 10 MG PO TABS: ORAL_TABLET | ORAL | 0 refills | Status: DC

## 2023-01-15 NOTE — Telephone Encounter (Signed)
Advised pt that we sent in a prednisone taper pack . She expressed verbal understanding and was thankful

## 2023-01-15 NOTE — Telephone Encounter (Signed)
Caller name: Dereka Lewellyn  On DPR?: Yes  Call back number: [phone] (mobile)  Provider they see: Midge Minium, MD  Reason for call: L Ear is still has fluid behind it. Advise

## 2023-01-15 NOTE — Telephone Encounter (Signed)
I sent in a prednisone taper to help improve the inflammation and congestion.

## 2023-01-15 NOTE — Telephone Encounter (Signed)
Pt reports continued fluid behind the ear should patient come back in since it has been 2 weeks?

## 2023-02-09 ENCOUNTER — Encounter: Payer: Self-pay | Admitting: Family Medicine

## 2023-02-09 ENCOUNTER — Telehealth (INDEPENDENT_AMBULATORY_CARE_PROVIDER_SITE_OTHER): Payer: Managed Care, Other (non HMO) | Admitting: Family Medicine

## 2023-02-09 DIAGNOSIS — J209 Acute bronchitis, unspecified: Secondary | ICD-10-CM | POA: Diagnosis not present

## 2023-02-09 MED ORDER — AZITHROMYCIN 250 MG PO TABS: ORAL_TABLET | ORAL | 0 refills | Status: DC

## 2023-02-09 MED ORDER — ALBUTEROL SULFATE HFA 108 (90 BASE) MCG/ACT IN AERS: 2.0000 | INHALATION_SPRAY | Freq: Four times a day (QID) | RESPIRATORY_TRACT | 0 refills | Status: DC | PRN

## 2023-02-09 NOTE — Progress Notes (Signed)
   Virtual Visit via Video   I connected with patient on 02/09/23 at  9:20 AM EDT by a video enabled telemedicine application and verified that I am speaking with the correct person using two identifiers.  Location patient: Home Location provider: [facility] Persons participating in the virtual visit: Patient, Provider, Berwind Marcille Blanco C)  I discussed the limitations of evaluation and management by telemedicine and the availability of in person appointments. The patient expressed understanding and agreed to proceed.  Subjective:   HPI:   'felt like someone was sitting on my chest on Saturday.  It felt like bronchitis'- denies N/V/diaphoresis, no radiation of pain.  Started wheezing on Sunday.  Started Mucinex w/ some improvement.  Cough is loose and wet.  No fevers.  + SOB.  Denies body aches or HA.  Pt has no hx of asthma but does tend to wheeze when she gets URI's.  'i can hear my chest gurgling'.    ROS:   See pertinent positives and negatives per HPI.  Patient Active Problem List   Diagnosis Date Noted   Vitamin D deficiency 10/23/2020   Generalized anxiety disorder 05/15/2016   Irregular menses 05/15/2016   FH: colon cancer 02/25/2016   Anemia 11/13/2015   Attention deficit hyperactivity disorder (ADHD) 10/25/2015   Breast cancer screening 10/25/2015   Encounter for preventive health examination 10/25/2015   Obesity (BMI 30.0-34.9) 10/25/2015    Social History   Tobacco Use   Smoking status: Never   Smokeless tobacco: Never  Substance Use Topics   Alcohol use: No    Current Outpatient Medications:    Cyanocobalamin (VITAMIN B 12 PO), Take by mouth., Disp: , Rfl:    escitalopram (LEXAPRO) 20 MG tablet, Take 20 mg by mouth daily., Disp: , Rfl:    fluticasone (FLONASE) 50 MCG/ACT nasal spray, Place 2 sprays into both nostrils daily., Disp: 16 g, Rfl: 6   predniSONE (DELTASONE) 10 MG tablet, 3 tabs x3 days and then 2 tabs x3 days and then 1 tab x3 days.   Take w/ food. (Patient not taking: Reported on 02/09/2023), Disp: 18 tablet, Rfl: 0  Current Facility-Administered Medications:    0.9 %  sodium chloride infusion, 500 mL, Intravenous, Once, Gatha Mayer, MD  No Known Allergies  Objective:   There were no vitals taken for this visit. AAOx3, NAD NCAT, EOMI No obvious CN deficits Coloring WNL Pt is able to speak clearly, coherently without shortness of breath or increased work of breathing.  + wet cough Thought process is linear.  Mood is appropriate.   Assessment and Plan:   Acute bronchitis w/ wheezing- new.  Pt reports hx of recurrent bronchitis and states this feels similar.  Did caution her that chest pressure could be a sign of a cardiac event and she needed to be very mindful of her sxs.  Pt feels strongly that this is not cardiac.  Start Zpack, Albuterol for wheezing.  Reviewed supportive care and red flags that should prompt return.  Pt expressed understanding and is in agreement w/ plan.    Annye Asa, MD 02/09/2023

## 2023-02-10 ENCOUNTER — Encounter: Payer: Self-pay | Admitting: Family Medicine

## 2023-02-10 MED ORDER — PROMETHAZINE-DM 6.25-15 MG/5ML PO SYRP: 5.0000 mL | ORAL_SOLUTION | Freq: Four times a day (QID) | ORAL | 0 refills | Status: DC | PRN

## 2023-02-17 ENCOUNTER — Encounter: Payer: Self-pay | Admitting: Family Medicine

## 2023-02-17 ENCOUNTER — Ambulatory Visit (INDEPENDENT_AMBULATORY_CARE_PROVIDER_SITE_OTHER): Payer: Managed Care, Other (non HMO) | Admitting: Family Medicine

## 2023-02-17 VITALS — BP 108/68 | HR 68 | Ht 68.0 in | Wt 224.0 lb

## 2023-02-17 DIAGNOSIS — J209 Acute bronchitis, unspecified: Secondary | ICD-10-CM | POA: Diagnosis not present

## 2023-02-17 DIAGNOSIS — R051 Acute cough: Secondary | ICD-10-CM | POA: Diagnosis not present

## 2023-02-17 NOTE — Progress Notes (Signed)
   Acute Office Visit   Subjective:  Patient ID: Jenny Little, female    DOB: 03-16-1976, 47 y.o.   MRN: [id number]  Chief Complaint  Patient presents with   Hemoptysis    Pt is here today with C/O of bronchitis. Pt reports she has had coughing for 2 weeks. Pt reports she feels better today.  Pt has hx of pneumonia and would like to someone to listen to her lungs.      HPI Patient is a 17 year caucasian female who is following up with continued symptoms. She had a telehealth visit with Dr. Raliegh Ip. Tabori on 0/18/2024. Diagnosed with acute bronchitis with wheezing. Prescribed Zpack and Albuterol. She reports she completed full course of medication and did use the Albuterol Inhaler. Also, not taking Promethazine-DM cough syrup. She continues to use OTC Mucinex. On Sunday she felt better, but not completely symptom free. She reports she still had a loose and wet cough, and mild SHOB on exertion. Denies fever or chest pain. This morning she has woke up feeling better. Still coughing a little bit, but not like it was yesterday.  She reports she was going to cancel appointment since she was feeling better, but thought she may need to have her lungs assessed.   ROS See HPI above      Objective:    BP 108/68   Pulse 68   Ht 5\' 8"  (1.727 m)   Wt 224 lb (101.6 kg)   SpO2 95%   BMI 34.06 kg/m    Physical Exam Constitutional:      General: She is not in acute distress.    Appearance: Normal appearance. She is obese. She is not ill-appearing, toxic-appearing or diaphoretic.  HENT:     Head: Normocephalic and atraumatic.  Eyes:     General:        Right eye: No discharge.        Left eye: No discharge.     Conjunctiva/sclera: Conjunctivae normal.  Cardiovascular:     Rate and Rhythm: Normal rate and regular rhythm.     Pulses: Normal pulses.     Heart sounds: No murmur heard.    No friction rub. No gallop.  Pulmonary:     Effort: Pulmonary effort is normal. No respiratory distress.      Breath sounds: Normal breath sounds. No wheezing.     Comments: Dry cough once during visit.  Skin:    General: Skin is warm and dry.  Neurological:     General: No focal deficit present.     Mental Status: She is alert and oriented to person, place, and time.  Psychiatric:        Mood and Affect: Mood normal.        Behavior: Behavior normal.        Thought Content: Thought content normal.        Judgment: Judgment normal.      Assessment & Plan:  Acute cough  Acute bronchitis with wheezing  -Based on physical exam, lungs are clear. Cough is improving and denies SHOB.  -Continue with Mucinex over the counter.  -Stay hydrated.  -If symptoms change or become worse, call back to the office. Will order chest x-ray if symptoms become worse.  -Discontinued medication Zpack, Albuterol Inhaler, and Promethazine-DM since she has completed medication and not taking the others.   No follow-ups on file.  Valarie Merino, NP

## 2023-02-17 NOTE — Patient Instructions (Signed)
-  Continue with Mucinex over the counter.  -Stay hydrated.  -If symptoms change or become worse, call back to the office. Will order chest x-ray if symptoms become worse.

## 2023-04-29 ENCOUNTER — Telehealth (INDEPENDENT_AMBULATORY_CARE_PROVIDER_SITE_OTHER): Payer: Managed Care, Other (non HMO) | Admitting: Family Medicine

## 2023-04-29 ENCOUNTER — Encounter: Payer: Self-pay | Admitting: Family Medicine

## 2023-04-29 DIAGNOSIS — R42 Dizziness and giddiness: Secondary | ICD-10-CM

## 2023-04-29 DIAGNOSIS — R0683 Snoring: Secondary | ICD-10-CM | POA: Diagnosis not present

## 2023-04-29 DIAGNOSIS — R5383 Other fatigue: Secondary | ICD-10-CM | POA: Diagnosis not present

## 2023-04-29 MED ORDER — PREDNISONE 10 MG PO TABS: ORAL_TABLET | ORAL | 0 refills | Status: DC

## 2023-04-29 MED ORDER — ONDANSETRON HCL 4 MG PO TABS: 4.0000 mg | ORAL_TABLET | Freq: Three times a day (TID) | ORAL | 1 refills | Status: AC | PRN

## 2023-04-29 MED ORDER — MECLIZINE HCL 25 MG PO TABS: 25.0000 mg | ORAL_TABLET | Freq: Three times a day (TID) | ORAL | 0 refills | Status: DC | PRN

## 2023-04-29 NOTE — Progress Notes (Signed)
   Virtual Visit via Video   I connected with patient on 04/29/23 at  7:40 AM EDT by a video enabled telemedicine application and verified that I am speaking with the correct person using two identifiers.  Location patient: Home Location provider: [place] [facility], [facility] Persons participating in the virtual visit: Patient, Provider, CMA Sheryle Hail C)  I discussed the limitations of evaluation and management by telemedicine and the availability of in person appointments. The patient expressed understanding and agreed to proceed.  Subjective:   HPI:   Vertigo- pt reports sxs started yesterday.  Pt reports spinning will stop as long as head stays still.  Ears feel full and has sinus pressure.  Pt has hx of vertigo.    Fatigue- 'i feel tired all the time'  No fevers or chills.  Just restarted iron, Vit D and B12 yesterday.  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  Took 6.5 hr nap on Sunday.  Pt has been told she snores loudly and has breathing pauses.  ROS:   See pertinent positives and negatives per HPI.  Patient Active Problem List   Diagnosis Date Noted   Vitamin D deficiency 10/23/2020   Generalized anxiety disorder 05/15/2016   Irregular menses 05/15/2016   FH: colon cancer 02/25/2016   Anemia 11/13/2015   Attention deficit hyperactivity disorder (ADHD) 10/25/2015   Breast cancer screening 10/25/2015   Encounter for preventive health examination 10/25/2015   Obesity (BMI 30.0-34.9) 10/25/2015    Social History   Tobacco Use   Smoking status: Never   Smokeless tobacco: Never  Substance Use Topics   Alcohol use: No    Current Outpatient Medications:    Cyanocobalamin (VITAMIN B 12 PO), Take by mouth., Disp: , Rfl:    escitalopram (LEXAPRO) 20 MG tablet, Take 20 mg by mouth daily., Disp: , Rfl:    fluticasone (FLONASE) 50 MCG/ACT nasal spray, Place 2 sprays into both nostrils daily., Disp: 16 g, Rfl: 6  No Known Allergies  Objective:   There were no vitals  taken for this visit. AAOx3, NAD NCAT, EOMI No obvious CN deficits Pt unable to turn head w/o dizziness Coloring WNL Pt is able to speak clearly, coherently without shortness of breath or increased work of breathing.  Thought process is linear.  Mood is appropriate.   Assessment and Plan:   Vertigo- deteriorated.  Pt has hx of similar.  She has sinus pressure and head fullness but doesn't feel that she has a sinus infection- just inflammation.  Restart Meclizine and Zofran.  Will do steroid taper for inflammation and possible labyrinthitis.  Reviewed supportive care and red flags that should prompt return.  Pt expressed understanding and is in agreement w/ plan.   Fatigue- new.  Pt reports she is having excessive daytime sleepiness and is napping more than ever.  She knows that she is stressed at work as she is currently working 2 jobs.  Will check labs to r/o metabolic causes and will also get sleep study to evaluate for OSA.  Pt expressed understanding and is in agreement w/ plan.   Snoring- new.  Pt has been told she snores loudly and has breathing pauses.  Will refer for sleep study- particularly in the setting of excessive daytime sleepiness.  Pt expressed understanding and is in agreement w/ plan.    Neena Rhymes, MD 04/29/2023

## 2023-05-08 ENCOUNTER — Encounter: Payer: Self-pay | Admitting: Family Medicine

## 2023-05-19 ENCOUNTER — Encounter: Payer: Self-pay | Admitting: Family Medicine

## 2023-05-19 ENCOUNTER — Ambulatory Visit (INDEPENDENT_AMBULATORY_CARE_PROVIDER_SITE_OTHER): Payer: Managed Care, Other (non HMO) | Admitting: Family Medicine

## 2023-05-19 VITALS — BP 128/80 | HR 82 | Temp 98.1°F | Resp 17 | Ht 68.0 in | Wt 221.2 lb

## 2023-05-19 DIAGNOSIS — Z0000 Encounter for general adult medical examination without abnormal findings: Secondary | ICD-10-CM

## 2023-05-19 DIAGNOSIS — E669 Obesity, unspecified: Secondary | ICD-10-CM | POA: Diagnosis not present

## 2023-05-19 DIAGNOSIS — E559 Vitamin D deficiency, unspecified: Secondary | ICD-10-CM | POA: Diagnosis not present

## 2023-05-19 DIAGNOSIS — Z1239 Encounter for other screening for malignant neoplasm of breast: Secondary | ICD-10-CM | POA: Diagnosis not present

## 2023-05-19 LAB — TSH: TSH: 2.06 u[IU]/mL (ref 0.35–5.50)

## 2023-05-19 LAB — CBC WITH DIFFERENTIAL/PLATELET
Basophils Absolute: 0 10*3/uL (ref 0.0–0.1)
Basophils Relative: 0.5 % (ref 0.0–3.0)
Eosinophils Absolute: 0.2 10*3/uL (ref 0.0–0.7)
Eosinophils Relative: 2.1 % (ref 0.0–5.0)
HCT: 39.4 % (ref 36.0–46.0)
Hemoglobin: 12.6 g/dL (ref 12.0–15.0)
Lymphocytes Relative: 25.5 % (ref 12.0–46.0)
Lymphs Abs: 1.9 10*3/uL (ref 0.7–4.0)
MCHC: 32.1 g/dL (ref 30.0–36.0)
MCV: 83.1 fl (ref 78.0–100.0)
Monocytes Absolute: 0.4 10*3/uL (ref 0.1–1.0)
Monocytes Relative: 5.8 % (ref 3.0–12.0)
Neutro Abs: 5 10*3/uL (ref 1.4–7.7)
Neutrophils Relative %: 66.1 % (ref 43.0–77.0)
Platelets: 307 10*3/uL (ref 150.0–400.0)
RBC: 4.75 Mil/uL (ref 3.87–5.11)
RDW: 16.2 % — ABNORMAL HIGH (ref 11.5–15.5)
WBC: 7.6 10*3/uL (ref 4.0–10.5)

## 2023-05-19 LAB — BASIC METABOLIC PANEL
BUN: 10 mg/dL (ref 6–23)
CO2: 28 mEq/L (ref 19–32)
Calcium: 9.2 mg/dL (ref 8.4–10.5)
Chloride: 100 mEq/L (ref 96–112)
Creatinine, Ser: 0.61 mg/dL (ref 0.40–1.20)
GFR: 105.49 mL/min (ref >60.00)
Glucose, Bld: 101 mg/dL — ABNORMAL HIGH (ref 70–99)
Potassium: 4 mEq/L (ref 3.5–5.1)
Sodium: 136 mEq/L (ref 135–145)

## 2023-05-19 LAB — HEPATIC FUNCTION PANEL
ALT: 21 U/L (ref 0–35)
AST: 21 U/L (ref 0–37)
Albumin: 4.2 g/dL (ref 3.5–5.2)
Alkaline Phosphatase: 52 U/L (ref 39–117)
Bilirubin, Direct: 0.1 mg/dL (ref 0.0–0.3)
Total Bilirubin: 0.5 mg/dL (ref 0.2–1.2)
Total Protein: 7.3 g/dL (ref 6.0–8.3)

## 2023-05-19 LAB — LIPID PANEL
Cholesterol: 270 mg/dL — ABNORMAL HIGH (ref 0–200)
HDL: 50.6 mg/dL (ref >39.00)
NonHDL: 218.97
Total CHOL/HDL Ratio: 5
Triglycerides: 386 mg/dL — ABNORMAL HIGH (ref 0.0–149.0)
VLDL: 77.2 mg/dL — ABNORMAL HIGH (ref 0.0–40.0)

## 2023-05-19 LAB — VITAMIN D 25 HYDROXY (VIT D DEFICIENCY, FRACTURES): VITD: 34.38 ng/mL (ref 30.00–100.00)

## 2023-05-19 LAB — LDL CHOLESTEROL, DIRECT: Direct LDL: 166 mg/dL

## 2023-05-19 MED ORDER — AMPHETAMINE-DEXTROAMPHET ER 5 MG PO CP24: 5.0000 mg | ORAL_CAPSULE | Freq: Every day | ORAL | 0 refills | Status: DC

## 2023-05-19 NOTE — Assessment & Plan Note (Signed)
Pt's PE WNL w/ exception of BMI.  UTD on pap, colonoscopy, Tdap.  Mammo ordered.  Check labs.  Anticipatory guidance provided.

## 2023-05-19 NOTE — Assessment & Plan Note (Signed)
Mammo ordered.

## 2023-05-19 NOTE — Assessment & Plan Note (Signed)
Ongoing issue for pt.  BMI now 33.64  Encouraged low carb diet and regular exercise.  Check labs to risk stratify.  Will follow.

## 2023-05-19 NOTE — Progress Notes (Signed)
   Subjective:    Patient ID: Jenny Little, female    DOB: 09-12-1976, 47 y.o.   MRN: [id number]  HPI CPE- UTD on pap, mammo, Tdap, colonoscopy  Patient Care Team    Relationship Specialty Notifications Start End  Sheliah Hatch, MD PCP - General Family Medicine  05/15/16      Health Maintenance  Topic Date Due   INFLUENZA VACCINE  06/25/2023   PAP SMEAR-Modifier  08/09/2024   DTaP/Tdap/Td (2 - Tdap) 10/24/2024   Colonoscopy  09/12/2032   HPV VACCINES  Aged Out   COVID-19 Vaccine  Discontinued   Hepatitis C Screening  Discontinued   HIV Screening  Discontinued     Review of Systems Patient reports no vision/ hearing changes, adenopathy,fever, weight change,  persistant/recurrent hoarseness , swallowing issues, chest pain, palpitations, edema, persistant/recurrent cough, hemoptysis, dyspnea (rest/exertional/paroxysmal nocturnal), gastrointestinal bleeding (melena, rectal bleeding), abdominal pain, significant heartburn, bowel changes, GU symptoms (dysuria, hematuria, incontinence), Gyn symptoms (abnormal  bleeding, pain),  syncope, focal weakness, memory loss, numbness & tingling, skin/hair/nail changes, abnormal bruising or bleeding, anxiety, or depression.     Objective:   Physical Exam General Appearance:    Alert, cooperative, no distress, appears stated age, obese  Head:    Normocephalic, without obvious abnormality, atraumatic  Eyes:    PERRL, conjunctiva/corneas clear, EOM's intact both eyes  Ears:    Normal TM's and external ear canals, both ears  Nose:   Nares normal, septum midline, mucosa normal, no drainage    or sinus tenderness  Throat:   Lips, mucosa, and tongue normal; teeth and gums normal  Neck:   Supple, symmetrical, trachea midline, no adenopathy;    Thyroid: no enlargement/tenderness/nodules  Back:     Symmetric, no curvature, ROM normal, no CVA tenderness  Lungs:     Clear to auscultation bilaterally, respirations unlabored  Chest Wall:    No  tenderness or deformity   Heart:    Regular rate and rhythm, S1 and S2 normal, no murmur, rub   or gallop  Breast Exam:    Deferred to GYN  Abdomen:     Soft, non-tender, bowel sounds active all four quadrants,    no masses, no organomegaly  Genitalia:    Deferred to GYN  Rectal:    Extremities:   Extremities normal, atraumatic, no cyanosis or edema  Pulses:   2+ and symmetric all extremities  Skin:   Skin color, texture, turgor normal, no rashes or lesions  Lymph nodes:   Cervical, supraclavicular, and axillary nodes normal  Neurologic:   CNII-XII intact, normal strength, sensation and reflexes    throughout          Assessment & Plan:

## 2023-05-19 NOTE — Assessment & Plan Note (Signed)
Check labs and replete prn. 

## 2023-05-19 NOTE — Patient Instructions (Addendum)
Follow up in 1 year or as needed We'll notify you of your lab results and make any changes if needed Continue to work on healthy diet and regular exercise- you can do it!! Get your mammogram!!!  (Order is in!) Restart the Adderall daily Call with any questions or concerns Have a great summer!!!

## 2023-05-20 ENCOUNTER — Other Ambulatory Visit: Payer: Self-pay

## 2023-05-20 ENCOUNTER — Telehealth: Payer: Self-pay

## 2023-05-20 DIAGNOSIS — E785 Hyperlipidemia, unspecified: Secondary | ICD-10-CM

## 2023-05-20 MED ORDER — ROSUVASTATIN CALCIUM 10 MG PO TABS
10.0000 mg | ORAL_TABLET | Freq: Every day | ORAL | 3 refills | Status: DC
Start: 2023-05-20 — End: 2023-07-09

## 2023-05-20 NOTE — Telephone Encounter (Signed)
Pt aware of lab results . Crestor 10 mg sent in and repeat liver function order is in . Lab only visit for 6 wks has been made

## 2023-05-20 NOTE — Telephone Encounter (Signed)
-----   Message from Sheliah Hatch, MD sent at 05/20/2023  7:28 AM EDT ----- Labs look good w/ exception of elevated total cholesterol, triglycerides (fatty part of blood), and LDL (bad cholesterol).  All of these will improve w/ low carb diet and regular exercise, but in the meantime, we need to start Crestor 20mg  nightly (#30, 3 refills).  We will repeat your liver functions at a lab only visit in 6 weeks (LFTs- dx hyperlipidemia) to ensure you are metabolizing the medication appropriately.  Remainder of labs look good

## 2023-05-22 ENCOUNTER — Encounter: Payer: Self-pay | Admitting: Family Medicine

## 2023-05-22 ENCOUNTER — Other Ambulatory Visit (HOSPITAL_COMMUNITY): Payer: Self-pay

## 2023-05-22 ENCOUNTER — Telehealth: Payer: Self-pay

## 2023-05-22 ENCOUNTER — Telehealth: Payer: Self-pay | Admitting: Pharmacy Technician

## 2023-05-22 NOTE — Telephone Encounter (Signed)
Good morning , Pt's Rx Adderall needs a PA . DX Adult ADHD . She took this years ago then came off of medication . Dr Beverely Low started it back a few days ago

## 2023-05-22 NOTE — Telephone Encounter (Signed)
Sent a my chart message to pt

## 2023-05-22 NOTE — Telephone Encounter (Signed)
PA approved. Called pharmacy, they received a paid claim. They should have prescription ready after lunch.

## 2023-05-22 NOTE — Telephone Encounter (Signed)
Pharmacy Patient Advocate Encounter  Received notification from [facility] that Prior Authorization for Amphetamine-Dextroamphet ER 5MG  er capsules has been APPROVED from 05/22/2023 to 05/21/2024.Marland Kitchen  PA #/Case ID/Reference #: [id number]  Key: Karen Chafe

## 2023-05-25 ENCOUNTER — Other Ambulatory Visit: Payer: Self-pay

## 2023-05-25 DIAGNOSIS — E785 Hyperlipidemia, unspecified: Secondary | ICD-10-CM

## 2023-05-25 MED ORDER — ROSUVASTATIN CALCIUM 20 MG PO TABS
20.0000 mg | ORAL_TABLET | Freq: Every day | ORAL | 3 refills | Status: DC
Start: 2023-05-25 — End: 2023-12-03

## 2023-06-03 ENCOUNTER — Telehealth: Payer: Self-pay | Admitting: Family Medicine

## 2023-06-03 NOTE — Telephone Encounter (Signed)
This is a review placed in Dr Beverely Low review folder

## 2023-06-03 NOTE — Telephone Encounter (Signed)
[employer] - Prior Authorization   Placed in front bin

## 2023-06-11 ENCOUNTER — Institutional Professional Consult (permissible substitution): Payer: Managed Care, Other (non HMO) | Admitting: Neurology

## 2023-06-15 ENCOUNTER — Other Ambulatory Visit: Payer: Self-pay | Admitting: Family Medicine

## 2023-06-15 MED ORDER — AMPHETAMINE-DEXTROAMPHET ER 5 MG PO CP24: 5.0000 mg | ORAL_CAPSULE | Freq: Every day | ORAL | 0 refills | Status: DC

## 2023-06-15 NOTE — Telephone Encounter (Signed)
Adderall XR  5 mg LOV: 05/19/23 Last Refill:05/18/23 Upcoming appt: 06/26/23

## 2023-06-16 ENCOUNTER — Encounter: Payer: Self-pay | Admitting: Family Medicine

## 2023-06-16 ENCOUNTER — Telehealth (INDEPENDENT_AMBULATORY_CARE_PROVIDER_SITE_OTHER): Payer: Managed Care, Other (non HMO) | Admitting: Family Medicine

## 2023-06-16 DIAGNOSIS — B9689 Other specified bacterial agents as the cause of diseases classified elsewhere: Secondary | ICD-10-CM | POA: Diagnosis not present

## 2023-06-16 DIAGNOSIS — J329 Chronic sinusitis, unspecified: Secondary | ICD-10-CM

## 2023-06-16 MED ORDER — AMOXICILLIN 875 MG PO TABS: 875.0000 mg | ORAL_TABLET | Freq: Two times a day (BID) | ORAL | 0 refills | Status: AC

## 2023-06-16 NOTE — Progress Notes (Signed)
   Virtual Visit via Video   I connected with patient on 06/16/23 at  9:40 AM EDT by a video enabled telemedicine application and verified that I am speaking with the correct person using two identifiers.  Location patient: Home Location provider: [place] [facility], Office Persons participating in the virtual visit: Patient, Provider, CMA Sheryle Hail C)  I discussed the limitations of evaluation and management by telemedicine and the availability of in person appointments. The patient expressed understanding and agreed to proceed.  Subjective:   HPI:   'i just don't feel good'- had migraine 6 days ago and since has had a nagging HA.  + facial pain/pressure, tooth pain.  + chills yesterday.  No fever.  + body aches.  No known sick contacts.  Took COVID test yesterday and was negative.  ROS:   See pertinent positives and negatives per HPI.  Patient Active Problem List   Diagnosis Date Noted   Vitamin D deficiency 10/23/2020   Generalized anxiety disorder 05/15/2016   Irregular menses 05/15/2016   FH: colon cancer 02/25/2016   Anemia 11/13/2015   Attention deficit hyperactivity disorder (ADHD) 10/25/2015   Breast cancer screening 10/25/2015   Encounter for preventive health examination 10/25/2015   Obesity (BMI 30.0-34.9) 10/25/2015    Social History   Tobacco Use   Smoking status: Never   Smokeless tobacco: Never  Substance Use Topics   Alcohol use: No    Current Outpatient Medications:    amphetamine-dextroamphetamine (ADDERALL XR) 5 MG 24 hr capsule, Take 1 capsule (5 mg total) by mouth daily., Disp: 30 capsule, Rfl: 0   Cyanocobalamin (VITAMIN B 12 PO), Take by mouth., Disp: , Rfl:    escitalopram (LEXAPRO) 20 MG tablet, Take 20 mg by mouth daily., Disp: , Rfl:    fluticasone (FLONASE) 50 MCG/ACT nasal spray, Place 2 sprays into both nostrils daily., Disp: 16 g, Rfl: 6   meclizine (ANTIVERT) 25 MG tablet, Take 1 tablet (25 mg total) by mouth 3 (three) times daily as  needed for dizziness., Disp: 60 tablet, Rfl: 0   ondansetron (ZOFRAN) 4 MG tablet, Take 1 tablet (4 mg total) by mouth every 8 (eight) hours as needed for nausea or vomiting., Disp: 30 tablet, Rfl: 1   rosuvastatin (CRESTOR) 20 MG tablet, Take 1 tablet (20 mg total) by mouth daily., Disp: 30 tablet, Rfl: 3   rosuvastatin (CRESTOR) 10 MG tablet, Take 1 tablet (10 mg total) by mouth daily. (Patient not taking: Reported on 06/16/2023), Disp: 30 tablet, Rfl: 3  No Known Allergies  Objective:   There were no vitals taken for this visit. AAOx3, NAD NCAT, EOMI No obvious CN deficits Coloring WNL Pt is able to speak clearly, coherently without shortness of breath or increased work of breathing.  Thought process is linear.  Mood is appropriate.   Assessment and Plan:   Bacterial sinusitis- pt has hx of similar.  Reports sxs of congestion, facial pain, tooth pain are consistent but her persistent HA's and body aches feel different.  Will start Amoxicillin but encouraged her to take a 2nd COVID test tomorrow just to be sure.  Pt expressed understanding and is in agreement w/ plan.   Neena Rhymes, MD 06/16/2023

## 2023-06-16 NOTE — Telephone Encounter (Signed)
Pt aware of refill.

## 2023-06-25 ENCOUNTER — Other Ambulatory Visit: Payer: Self-pay | Admitting: Family Medicine

## 2023-06-25 ENCOUNTER — Other Ambulatory Visit: Payer: Self-pay

## 2023-06-25 MED ORDER — ESCITALOPRAM OXALATE 20 MG PO TABS
20.0000 mg | ORAL_TABLET | Freq: Every day | ORAL | 0 refills | Status: DC
  Filled 2023-06-25: qty 90, 90d supply, fill #0

## 2023-06-26 ENCOUNTER — Other Ambulatory Visit: Payer: Managed Care, Other (non HMO)

## 2023-06-26 ENCOUNTER — Encounter: Payer: Self-pay | Admitting: Family Medicine

## 2023-06-30 ENCOUNTER — Telehealth: Payer: Self-pay | Admitting: Family Medicine

## 2023-06-30 NOTE — Telephone Encounter (Signed)
Encourage patient to contact the pharmacy for refills or they can request refills through [facility]  WHAT PHARMACY WOULD THEY LIKE THIS SENT TO:  [id number] - [place], [place] - [address]    MEDICATION NAME & DOSE: escitalopram (LEXAPRO) 20 MG tablet   NOTES/COMMENTS FROM PATIENT:      Front office please notify patient: It takes 48-72 hours to process rx refill requests Ask patient to call pharmacy to ensure rx is ready before heading there.

## 2023-07-01 ENCOUNTER — Encounter: Payer: Self-pay | Admitting: Family Medicine

## 2023-07-02 ENCOUNTER — Other Ambulatory Visit: Payer: Self-pay

## 2023-07-02 MED ORDER — ESCITALOPRAM OXALATE 20 MG PO TABS: 20.0000 mg | ORAL_TABLET | Freq: Every day | ORAL | 0 refills | Status: DC

## 2023-07-09 ENCOUNTER — Encounter: Payer: Self-pay | Admitting: Family Medicine

## 2023-07-09 ENCOUNTER — Telehealth: Payer: Managed Care, Other (non HMO) | Admitting: Family Medicine

## 2023-07-09 DIAGNOSIS — F902 Attention-deficit hyperactivity disorder, combined type: Secondary | ICD-10-CM | POA: Diagnosis not present

## 2023-07-09 MED ORDER — AMPHETAMINE-DEXTROAMPHETAMINE 5 MG PO TABS: 5.0000 mg | ORAL_TABLET | Freq: Two times a day (BID) | ORAL | 0 refills | Status: DC

## 2023-07-09 NOTE — Progress Notes (Signed)
   Virtual Visit via Video   I connected with patient on 07/09/23 at 10:20 AM EDT by a video enabled telemedicine application and verified that I am speaking with the correct person using two identifiers.  Location patient: Home Location provider: [place], Office Persons participating in the virtual visit: Patient, Provider, CMA Sheryle Hail C)  I discussed the limitations of evaluation and management by telemedicine and the availability of in person appointments. The patient expressed understanding and agreed to proceed.  Subjective:   HPI:   ADHD- stopped taking the 5mg  Adderall XR b/c she doesn't like how it makes her feel and feels it lasts too long.  Would like to switch to immediate release twice daily if needed b/c it gives her more freedom w/ her dosing.    ROS:   See pertinent positives and negatives per HPI.  Patient Active Problem List   Diagnosis Date Noted   Vitamin D deficiency 10/23/2020   Generalized anxiety disorder 05/15/2016   Irregular menses 05/15/2016   FH: colon cancer 02/25/2016   Anemia 11/13/2015   Attention deficit hyperactivity disorder (ADHD) 10/25/2015   Breast cancer screening 10/25/2015   Encounter for preventive health examination 10/25/2015   Obesity (BMI 30.0-34.9) 10/25/2015    Social History   Tobacco Use   Smoking status: Never   Smokeless tobacco: Never  Substance Use Topics   Alcohol use: No    Current Outpatient Medications:    amphetamine-dextroamphetamine (ADDERALL XR) 5 MG 24 hr capsule, Take 1 capsule (5 mg total) by mouth daily., Disp: 30 capsule, Rfl: 0   Cyanocobalamin (VITAMIN B 12 PO), Take by mouth., Disp: , Rfl:    escitalopram (LEXAPRO) 20 MG tablet, Take 1 tablet (20 mg total) by mouth daily. Take 20 mg by mouth daily., Disp: 90 tablet, Rfl: 0   fluticasone (FLONASE) 50 MCG/ACT nasal spray, Place 2 sprays into both nostrils daily., Disp: 16 g, Rfl: 6   meclizine (ANTIVERT) 25 MG tablet, Take 1 tablet (25 mg  total) by mouth 3 (three) times daily as needed for dizziness., Disp: 60 tablet, Rfl: 0   ondansetron (ZOFRAN) 4 MG tablet, Take 1 tablet (4 mg total) by mouth every 8 (eight) hours as needed for nausea or vomiting., Disp: 30 tablet, Rfl: 1   rosuvastatin (CRESTOR) 20 MG tablet, Take 1 tablet (20 mg total) by mouth daily., Disp: 30 tablet, Rfl: 3  No Known Allergies  Objective:   There were no vitals taken for this visit. AAOx3, NAD NCAT, EOMI No obvious CN deficits Coloring WNL Pt is able to speak clearly, coherently without shortness of breath or increased work of breathing.  Thought process is linear.  Mood is appropriate.   Assessment and Plan:   ADHD- pt doesn't like the long lasting effects of the extended release.  She prefers the short acting that gives her the option of taking an afternoon dose if needed rather than the medicine lingering in her system.  Prescription changed to 5mg  BID.  Pt expressed understanding and is in agreement w/ plan.    Neena Rhymes, MD 07/09/2023

## 2023-07-10 ENCOUNTER — Encounter: Payer: Self-pay | Admitting: Family Medicine

## 2023-07-10 ENCOUNTER — Other Ambulatory Visit (HOSPITAL_COMMUNITY): Payer: Self-pay

## 2023-07-10 ENCOUNTER — Telehealth: Payer: Self-pay

## 2023-07-10 NOTE — Telephone Encounter (Signed)
PA request has been Approved. New Encounter created for follow up. For additional info see Pharmacy Prior Auth telephone encounter from 07/10/23.

## 2023-07-10 NOTE — Telephone Encounter (Signed)
Pharmacy needs a PA for Adderall 5 mg BID  Dx Adult ADHD . ..Marland KitchenMarland Kitchen

## 2023-07-10 NOTE — Telephone Encounter (Signed)
error 

## 2023-07-10 NOTE — Telephone Encounter (Signed)
Pharmacy Patient Advocate Encounter   Received notification from Pt Calls Messages that prior authorization for Amphetamine-Dextroamphetamine 5MG  tablets is required/requested.   Insurance verification completed.   The patient is insured through [employer] .   Per test claim: APPROVED from 07/10/23 to 07/09/24. Ran test claim, Copay is $10. This test claim was processed through [facility]- copay amounts may vary at other pharmacies due to pharmacy/plan contracts, or as the patient moves through the different stages of their insurance plan.   KeyMichelle Piper PA Case ID #: [id number]

## 2023-07-16 ENCOUNTER — Encounter: Payer: Self-pay | Admitting: Neurology

## 2023-07-16 ENCOUNTER — Institutional Professional Consult (permissible substitution): Payer: Managed Care, Other (non HMO) | Admitting: Neurology

## 2023-08-04 ENCOUNTER — Encounter: Payer: Self-pay | Admitting: Family Medicine

## 2023-08-04 ENCOUNTER — Other Ambulatory Visit: Payer: Self-pay

## 2023-08-04 NOTE — Telephone Encounter (Signed)
Adderall 5 mg Requested Prescriptions    No prescriptions requested or ordered in this encounter     Date of patient request: 08/04/23 Last office visit: 07/09/23 Date of last refill: 07/09/23 Last refill amount: 60 Follow up time period per chart: did not state

## 2023-08-05 MED ORDER — AMPHETAMINE-DEXTROAMPHETAMINE 5 MG PO TABS: 5.0000 mg | ORAL_TABLET | Freq: Two times a day (BID) | ORAL | 0 refills | Status: DC

## 2023-08-07 ENCOUNTER — Other Ambulatory Visit: Payer: Self-pay | Admitting: Family Medicine

## 2023-08-07 DIAGNOSIS — E785 Hyperlipidemia, unspecified: Secondary | ICD-10-CM

## 2023-08-19 ENCOUNTER — Other Ambulatory Visit: Payer: Self-pay | Admitting: Family Medicine

## 2023-08-19 DIAGNOSIS — E785 Hyperlipidemia, unspecified: Secondary | ICD-10-CM

## 2023-08-24 ENCOUNTER — Telehealth (INDEPENDENT_AMBULATORY_CARE_PROVIDER_SITE_OTHER): Payer: Managed Care, Other (non HMO) | Admitting: Family Medicine

## 2023-08-24 ENCOUNTER — Encounter: Payer: Self-pay | Admitting: Family Medicine

## 2023-08-24 DIAGNOSIS — F902 Attention-deficit hyperactivity disorder, combined type: Secondary | ICD-10-CM | POA: Diagnosis not present

## 2023-08-24 MED ORDER — AMPHETAMINE-DEXTROAMPHETAMINE 10 MG PO TABS: 10.0000 mg | ORAL_TABLET | Freq: Two times a day (BID) | ORAL | 0 refills | Status: DC

## 2023-08-24 NOTE — Progress Notes (Signed)
   Virtual Visit via Video   I connected with patient on 08/24/23 at 10:20 AM EDT by a video enabled telemedicine application and verified that I am speaking with the correct person using two identifiers.  Location patient: Home Location provider: [place], Office Persons participating in the virtual visit: Patient, Provider, CMA (Terrah A)  I discussed the limitations of evaluation and management by telemedicine and the availability of in person appointments. The patient expressed understanding and agreed to proceed.  Subjective:   HPI:   ADHD- is currently taking 5mg  BID but this is not effective for her.  Has been taking 10mg  in the morning and 5-10mg  in the afternoon which has been more beneficial.  Would like to increase her dose.  ROS:   See pertinent positives and negatives per HPI.  Patient Active Problem List   Diagnosis Date Noted   Vitamin D deficiency 10/23/2020   Generalized anxiety disorder 05/15/2016   Irregular menses 05/15/2016   FH: colon cancer 02/25/2016   Anemia 11/13/2015   Attention deficit hyperactivity disorder (ADHD) 10/25/2015   Breast cancer screening 10/25/2015   Encounter for preventive health examination 10/25/2015   Obesity (BMI 30.0-34.9) 10/25/2015    Social History   Tobacco Use   Smoking status: Never   Smokeless tobacco: Never  Substance Use Topics   Alcohol use: No    Current Outpatient Medications:    amphetamine-dextroamphetamine (ADDERALL) 5 MG tablet, Take 1 tablet (5 mg total) by mouth 2 (two) times daily with a meal., Disp: 60 tablet, Rfl: 0   Cyanocobalamin (VITAMIN B 12 PO), Take by mouth., Disp: , Rfl:    escitalopram (LEXAPRO) 20 MG tablet, Take 1 tablet (20 mg total) by mouth daily. Take 20 mg by mouth daily., Disp: 90 tablet, Rfl: 0   fluticasone (FLONASE) 50 MCG/ACT nasal spray, Place 2 sprays into both nostrils daily., Disp: 16 g, Rfl: 6   meclizine (ANTIVERT) 25 MG tablet, Take 1 tablet (25 mg total) by  mouth 3 (three) times daily as needed for dizziness., Disp: 60 tablet, Rfl: 0   ondansetron (ZOFRAN) 4 MG tablet, Take 1 tablet (4 mg total) by mouth every 8 (eight) hours as needed for nausea or vomiting., Disp: 30 tablet, Rfl: 1   rosuvastatin (CRESTOR) 20 MG tablet, Take 1 tablet (20 mg total) by mouth daily., Disp: 30 tablet, Rfl: 3  No Known Allergies  Objective:   LMP 08/22/2023  AAOx3, NAD NCAT, EOMI No obvious CN deficits Coloring WNL Pt is able to speak clearly, coherently without shortness of breath or increased work of breathing.  Thought process is linear.  Mood is appropriate.   Assessment and Plan:   ADHD- ongoing issue for pt.  She reports 5mg  Adderall is not enough to keep her on task.  She has been taking 2 tabs in the morning and either 1-2 tabs in the afternoon and this has worked much better for her.  Will increase dose to 10mg  BID.  New prescription sent.   Neena Rhymes, MD 08/24/2023

## 2023-09-02 ENCOUNTER — Encounter: Payer: Self-pay | Admitting: Family Medicine

## 2023-09-02 ENCOUNTER — Telehealth (INDEPENDENT_AMBULATORY_CARE_PROVIDER_SITE_OTHER): Payer: Managed Care, Other (non HMO) | Admitting: Family Medicine

## 2023-09-02 DIAGNOSIS — R6889 Other general symptoms and signs: Secondary | ICD-10-CM

## 2023-09-02 MED ORDER — OSELTAMIVIR PHOSPHATE 75 MG PO CAPS: 75.0000 mg | ORAL_CAPSULE | Freq: Two times a day (BID) | ORAL | 0 refills | Status: DC

## 2023-09-02 NOTE — Progress Notes (Signed)
   Virtual Visit via Video   I connected with patient on 09/02/23 at  9:40 AM EDT by a video enabled telemedicine application and verified that I am speaking with the correct person using two identifiers.  Location patient: Home Location provider: [facility], Office Persons participating in the virtual visit: Patient, Provider, CMA Archie Patten H)  I discussed the limitations of evaluation and management by telemedicine and the availability of in person appointments. The patient expressed understanding and agreed to proceed.  Subjective:   HPI:   Flu like illness- 'i think I had the flu'.  + sick contacts- husband had flu last week.  Sxs started Sunday w/ swollen lymph nodes.  'woke up 2023/10/04 and felt like death'.  + fatigue, weakness.  Skin is tender to touch.  Subjective fever, + chills.  + body aches.  + HA, nasal congestion.  Minimal cough.    ROS:   See pertinent positives and negatives per HPI.  Patient Active Problem List   Diagnosis Date Noted   Vitamin D deficiency 10/23/2020   Generalized anxiety disorder 05/15/2016   Irregular menses 05/15/2016   FH: colon cancer 02/25/2016   Anemia 11/13/2015   Attention deficit hyperactivity disorder (ADHD) 10/25/2015   Breast cancer screening 10/25/2015   Encounter for preventive health examination 10/25/2015   Obesity (BMI 30.0-34.9) 10/25/2015    Social History   Tobacco Use   Smoking status: Never   Smokeless tobacco: Never  Substance Use Topics   Alcohol use: No    Current Outpatient Medications:    amphetamine-dextroamphetamine (ADDERALL) 10 MG tablet, Take 1 tablet (10 mg total) by mouth 2 (two) times daily., Disp: 60 tablet, Rfl: 0   Cyanocobalamin (VITAMIN B 12 PO), Take by mouth., Disp: , Rfl:    escitalopram (LEXAPRO) 20 MG tablet, Take 1 tablet (20 mg total) by mouth daily. Take 20 mg by mouth daily., Disp: 90 tablet, Rfl: 0   fluticasone (FLONASE) 50 MCG/ACT nasal spray, Place 2 sprays into both nostrils  daily., Disp: 16 g, Rfl: 6   ondansetron (ZOFRAN) 4 MG tablet, Take 1 tablet (4 mg total) by mouth every 8 (eight) hours as needed for nausea or vomiting., Disp: 30 tablet, Rfl: 1   rosuvastatin (CRESTOR) 20 MG tablet, Take 1 tablet (20 mg total) by mouth daily., Disp: 30 tablet, Rfl: 3   meclizine (ANTIVERT) 25 MG tablet, Take 1 tablet (25 mg total) by mouth 3 (three) times daily as needed for dizziness. (Patient not taking: Reported on 09/02/2023), Disp: 60 tablet, Rfl: 0  No Known Allergies  Objective:   LMP 08/22/2023  AAOx3, NAD NCAT, EOMI No obvious CN deficits Coloring WNL Pt is able to speak clearly, coherently without shortness of breath or increased work of breathing.  Thought process is linear.  Mood is appropriate.   Assessment and Plan:   Flu like illness- new.  Pt reports husband was dx'd w/ flu last week.  Sunday she felt like her throat was swollen and then 04-Oct-2023 'woke up feeling like death'.  + HA, body aches, skin sensitivity, congestion.  Minimal cough.  Start Tamiflu given known exposure.  Reviewed supportive care and red flags that should prompt return.  Pt expressed understanding and is in agreement w/ plan.    Neena Rhymes, MD 09/02/2023

## 2023-09-21 ENCOUNTER — Encounter: Payer: Self-pay | Admitting: Family Medicine

## 2023-09-21 MED ORDER — AMPHETAMINE-DEXTROAMPHETAMINE 10 MG PO TABS: 10.0000 mg | ORAL_TABLET | Freq: Two times a day (BID) | ORAL | 0 refills | Status: DC

## 2023-09-21 NOTE — Addendum Note (Signed)
Addended by: Sheliah Hatch on: 09/21/2023 12:34 PM   Modules accepted: Orders

## 2023-09-22 ENCOUNTER — Other Ambulatory Visit: Payer: Self-pay | Admitting: Family Medicine

## 2023-09-22 ENCOUNTER — Encounter: Payer: Self-pay | Admitting: Family Medicine

## 2023-10-19 ENCOUNTER — Encounter: Payer: Self-pay | Admitting: Family Medicine

## 2023-10-19 MED ORDER — AMPHETAMINE-DEXTROAMPHETAMINE 10 MG PO TABS: 10.0000 mg | ORAL_TABLET | Freq: Two times a day (BID) | ORAL | 0 refills | Status: DC

## 2023-10-19 NOTE — Telephone Encounter (Signed)
Requested Prescriptions   Pending Prescriptions Disp Refills   amphetamine-dextroamphetamine (ADDERALL) 10 MG tablet 60 tablet 0    Sig: Take 1 tablet (10 mg total) by mouth 2 (two) times daily.     Date of patient request: 10/19/23 05/19/2023 Visit date not found Date of last refill: 09/21/2023 Last refill amount: 60

## 2023-10-28 ENCOUNTER — Telehealth: Payer: Managed Care, Other (non HMO) | Admitting: Family Medicine

## 2023-10-28 ENCOUNTER — Encounter: Payer: Self-pay | Admitting: Family Medicine

## 2023-10-28 DIAGNOSIS — B9689 Other specified bacterial agents as the cause of diseases classified elsewhere: Secondary | ICD-10-CM | POA: Diagnosis not present

## 2023-10-28 DIAGNOSIS — J329 Chronic sinusitis, unspecified: Secondary | ICD-10-CM | POA: Diagnosis not present

## 2023-10-28 MED ORDER — AMOXICILLIN 875 MG PO TABS: 875.0000 mg | ORAL_TABLET | Freq: Two times a day (BID) | ORAL | 0 refills | Status: AC

## 2023-10-28 NOTE — Progress Notes (Signed)
   Virtual Visit via Video   I connected with patient on 10/28/23 at  2:40 PM EST by a video enabled telemedicine application and verified that I am speaking with the correct person using two identifiers.  Location patient: Home Location provider: [facility], Office Persons participating in the virtual visit: Patient, Provider, CMA Archie Patten H)  I discussed the limitations of evaluation and management by telemedicine and the availability of in person appointments. The patient expressed understanding and agreed to proceed.  Subjective:   HPI:   URI- 'i've been blowing green snot and my sinuses were killing me'.  Pt had similar sxs ~1 month ago and had leftover abx.  She took 3 days worth and stopped and then sxs returned this week.  + low grade fever Sunday.  + fatigue.  + facial pain.  + tooth pain.  + HA.    ROS:   See pertinent positives and negatives per HPI.  Patient Active Problem List   Diagnosis Date Noted   Vitamin D deficiency 10/23/2020   Generalized anxiety disorder 05/15/2016   Irregular menses 05/15/2016   FH: colon cancer 02/25/2016   Anemia 11/13/2015   Attention deficit hyperactivity disorder (ADHD) 10/25/2015   Breast cancer screening 10/25/2015   Encounter for preventive health examination 10/25/2015   Obesity (BMI 30.0-34.9) 10/25/2015    Social History   Tobacco Use   Smoking status: Never   Smokeless tobacco: Never  Substance Use Topics   Alcohol use: No    Current Outpatient Medications:    amphetamine-dextroamphetamine (ADDERALL) 10 MG tablet, Take 1 tablet (10 mg total) by mouth 2 (two) times daily., Disp: 60 tablet, Rfl: 0   Cyanocobalamin (VITAMIN B 12 PO), Take by mouth., Disp: , Rfl:    escitalopram (LEXAPRO) 20 MG tablet, TAKE 1 TABLET (20 MG TOTAL) BY MOUTH DAILY. TAKE 20 MG BY MOUTH DAILY., Disp: 90 tablet, Rfl: 0   fluticasone (FLONASE) 50 MCG/ACT nasal spray, Place 2 sprays into both nostrils daily., Disp: 16 g, Rfl: 6   meclizine  (ANTIVERT) 25 MG tablet, Take 1 tablet (25 mg total) by mouth 3 (three) times daily as needed for dizziness., Disp: 60 tablet, Rfl: 0   ondansetron (ZOFRAN) 4 MG tablet, Take 1 tablet (4 mg total) by mouth every 8 (eight) hours as needed for nausea or vomiting., Disp: 30 tablet, Rfl: 1   oseltamivir (TAMIFLU) 75 MG capsule, Take 1 capsule (75 mg total) by mouth 2 (two) times daily., Disp: 10 capsule, Rfl: 0   rosuvastatin (CRESTOR) 20 MG tablet, Take 1 tablet (20 mg total) by mouth daily., Disp: 30 tablet, Rfl: 3  No Known Allergies  Objective:   There were no vitals taken for this visit. AAOx3, NAD NCAT, EOMI No obvious CN deficits Coloring WNL Pt is able to speak clearly, coherently without shortness of breath or increased work of breathing.  Thought process is linear.  Mood is appropriate.   Assessment and Plan:   Bacterial sinusitis- pt's sxs and PE consistent w/ infxn.  Start Amoxicillin and pt encouraged to complete entire course.  Reviewed supportive care and red flags that should prompt return.  Pt expressed understanding and is in agreement w/ plan.    Neena Rhymes, MD 10/28/2023

## 2023-11-16 ENCOUNTER — Encounter: Payer: Self-pay | Admitting: Family Medicine

## 2023-11-16 MED ORDER — AMPHETAMINE-DEXTROAMPHETAMINE 10 MG PO TABS: 10.0000 mg | ORAL_TABLET | Freq: Two times a day (BID) | ORAL | 0 refills | Status: DC

## 2023-12-03 ENCOUNTER — Telehealth: Payer: Managed Care, Other (non HMO) | Admitting: Family Medicine

## 2023-12-03 ENCOUNTER — Encounter: Payer: Self-pay | Admitting: Family Medicine

## 2023-12-03 DIAGNOSIS — F909 Attention-deficit hyperactivity disorder, unspecified type: Secondary | ICD-10-CM | POA: Diagnosis not present

## 2023-12-03 DIAGNOSIS — E785 Hyperlipidemia, unspecified: Secondary | ICD-10-CM

## 2023-12-03 MED ORDER — METHYLPHENIDATE HCL ER (OSM) 18 MG PO TBCR: 18.0000 mg | EXTENDED_RELEASE_TABLET | Freq: Every day | ORAL | 0 refills | Status: DC

## 2023-12-03 MED ORDER — ROSUVASTATIN CALCIUM 20 MG PO TABS: 20.0000 mg | ORAL_TABLET | Freq: Every day | ORAL | 3 refills | Status: DC

## 2023-12-03 NOTE — Progress Notes (Signed)
   Virtual Visit via Video   I connected with patient on 12/03/23 at  2:00 PM EST by a video enabled telemedicine application and verified that I am speaking with the correct person using two identifiers.  Location patient: Home Location provider: [facility], Office Persons participating in the virtual visit: Patient, Provider, CMA (Tonya H)  I discussed the limitations of evaluation and management by telemedicine and the availability of in person appointments. The patient expressed understanding and agreed to proceed.  Subjective:   HPI:   ADHD- on Adderall 10mg  BID.  Feels meds have plateau'ed and no longer as effective.  She has been taking 20mg  in the morning and 10mg  in the afternoon.  She reports when she gets up to 40mg  she develops palpitations.  Previously tried Vyvanse and didn't like how it made her feel.    ROS:   See pertinent positives and negatives per HPI.  Patient Active Problem List   Diagnosis Date Noted   Vitamin D  deficiency 10/23/2020   Generalized anxiety disorder 05/15/2016   Irregular menses 05/15/2016   FH: colon cancer 02/25/2016   Anemia 11/13/2015   Attention deficit hyperactivity disorder (ADHD) 10/25/2015   Breast cancer screening 10/25/2015   Encounter for preventive health examination 10/25/2015   Obesity (BMI 30.0-34.9) 10/25/2015    Social History   Tobacco Use   Smoking status: Never   Smokeless tobacco: Never  Substance Use Topics   Alcohol use: No    Current Outpatient Medications:    amphetamine -dextroamphetamine  (ADDERALL) 10 MG tablet, Take 1 tablet (10 mg total) by mouth 2 (two) times daily., Disp: 60 tablet, Rfl: 0   Cyanocobalamin  (VITAMIN B 12 PO), Take by mouth., Disp: , Rfl:    escitalopram  (LEXAPRO ) 20 MG tablet, TAKE 1 TABLET (20 MG TOTAL) BY MOUTH DAILY. TAKE 20 MG BY MOUTH DAILY., Disp: 90 tablet, Rfl: 0   fluticasone  (FLONASE ) 50 MCG/ACT nasal spray, Place 2 sprays into both nostrils daily., Disp: 16 g, Rfl:  6   meclizine  (ANTIVERT ) 25 MG tablet, Take 1 tablet (25 mg total) by mouth 3 (three) times daily as needed for dizziness., Disp: 60 tablet, Rfl: 0   ondansetron  (ZOFRAN ) 4 MG tablet, Take 1 tablet (4 mg total) by mouth every 8 (eight) hours as needed for nausea or vomiting., Disp: 30 tablet, Rfl: 1   oseltamivir  (TAMIFLU ) 75 MG capsule, Take 1 capsule (75 mg total) by mouth 2 (two) times daily., Disp: 10 capsule, Rfl: 0   rosuvastatin  (CRESTOR ) 20 MG tablet, Take 1 tablet (20 mg total) by mouth daily., Disp: 30 tablet, Rfl: 3  No Known Allergies  Objective:   There were no vitals taken for this visit. AAOx3, NAD NCAT, EOMI No obvious CN deficits Coloring WNL Pt is able to speak clearly, coherently without shortness of breath or increased work of breathing.  Thought process is linear.  Mood is appropriate.   Assessment and Plan:   ADHD- ongoing issue for pt.  She feels that her current dose of Adderall is not sufficient but she tends to get palpitations when the dose is increased.  She has never tried Concerta .  Will start 18mg  daily and monitor for symptom improvement.  Pt expressed understanding and is in agreement w/ plan.    Comer Greet, MD 12/03/2023

## 2023-12-22 ENCOUNTER — Telehealth: Payer: Self-pay

## 2023-12-22 ENCOUNTER — Other Ambulatory Visit: Payer: Self-pay | Admitting: Family Medicine

## 2023-12-22 NOTE — Telephone Encounter (Signed)
Left vm

## 2023-12-23 ENCOUNTER — Telehealth: Payer: Self-pay

## 2023-12-23 ENCOUNTER — Encounter: Payer: Self-pay | Admitting: Family Medicine

## 2023-12-23 ENCOUNTER — Ambulatory Visit: Payer: Managed Care, Other (non HMO) | Admitting: Family Medicine

## 2023-12-23 ENCOUNTER — Other Ambulatory Visit (HOSPITAL_COMMUNITY): Payer: Self-pay

## 2023-12-23 VITALS — BP 104/64 | HR 89 | Temp 97.9°F | Ht 68.0 in | Wt 220.1 lb

## 2023-12-23 DIAGNOSIS — R509 Fever, unspecified: Secondary | ICD-10-CM | POA: Diagnosis not present

## 2023-12-23 LAB — POC INFLUENZA A&B (BINAX/QUICKVUE)
Influenza A, POC: NEGATIVE
Influenza B, POC: NEGATIVE

## 2023-12-23 LAB — POC COVID19 BINAXNOW: SARS Coronavirus 2 Ag: NEGATIVE

## 2023-12-23 MED ORDER — ALBUTEROL SULFATE HFA 108 (90 BASE) MCG/ACT IN AERS
2.0000 | INHALATION_SPRAY | Freq: Four times a day (QID) | RESPIRATORY_TRACT | 0 refills | Status: AC | PRN
Start: 2023-12-23 — End: ?

## 2023-12-23 NOTE — Telephone Encounter (Signed)
Sent PA

## 2023-12-23 NOTE — Telephone Encounter (Signed)
Pt has been notified.

## 2023-12-23 NOTE — Patient Instructions (Signed)
Follow up as needed or as scheduled Thankfully flu and COVID were negative USE the Albuterol inhaler- 2 puffs every 4 hrs- as needed for cough, wheezing, or chest tightness Drink LOTS of fluids REST! Call with any questions or concerns Hang in there!

## 2023-12-23 NOTE — Telephone Encounter (Signed)
Pharmacy Patient Advocate Encounter   Received notification from Pt Calls Messages that prior authorization for Methylphenidate HCl ER TBCR 18MG  tablets is required/requested.   Insurance verification completed.   The patient is insured through [employer] .   Per test claim: PA required and submitted KEY/EOC/Request #: [id number] APPROVED from 12/23/23 to 12/22/24. Ran test claim, Copay is $10. This test claim was processed through [facility]- copay amounts may vary at other pharmacies due to pharmacy/plan contracts, or as the patient moves through the different stages of their insurance plan.   PA Case ID #: [id number]

## 2023-12-23 NOTE — Telephone Encounter (Signed)
Pt was seen in office 12/23/2023

## 2023-12-23 NOTE — Telephone Encounter (Signed)
PA request has been Approved. New Encounter created for follow up. For additional info see Pharmacy Prior Auth telephone encounter from 12/23/23.

## 2023-12-23 NOTE — Progress Notes (Signed)
   Subjective:    Patient ID: Jenny Little, female    DOB: 01-08-1976, 48 y.o.   MRN: [id number]  HPI URI- sxs started Sunday 1/26 w/ fatigue.  Developed congestion.  + subjective fever, chills.  + HA.  + mild dry cough but chest tightness w/ some SOB.  Faint wheezing.    Review of Systems For ROS see HPI     Objective:   Physical Exam Vitals reviewed.  Constitutional:      General: She is not in acute distress.    Appearance: She is not ill-appearing.  HENT:     Head: Normocephalic and atraumatic.     Right Ear: Tympanic membrane and ear canal normal.     Left Ear: Tympanic membrane and ear canal normal.     Nose: Congestion present.     Comments: No TTP over frontal or maxillary sinuses    Mouth/Throat:     Pharynx: Oropharynx is clear. No oropharyngeal exudate or posterior oropharyngeal erythema.  Eyes:     Extraocular Movements: Extraocular movements intact.     Conjunctiva/sclera: Conjunctivae normal.  Cardiovascular:     Rate and Rhythm: Normal rate and regular rhythm.  Pulmonary:     Effort: Pulmonary effort is normal. No respiratory distress.     Breath sounds: No wheezing or rhonchi.  Musculoskeletal:     Cervical back: Neck supple.  Lymphadenopathy:     Cervical: No cervical adenopathy.  Skin:    General: Skin is warm and dry.  Neurological:     General: No focal deficit present.     Mental Status: She is alert and oriented to person, place, and time.  Psychiatric:        Mood and Affect: Mood normal.        Behavior: Behavior normal.        Thought Content: Thought content normal.           Assessment & Plan:  Fever- new.  Most likely viral URI as no bacterial illness seen on exam.  Flu and COVID are both negative.  Reviewed supportive care and red flags that should prompt return.  Note provided for work.

## 2023-12-30 ENCOUNTER — Telehealth: Payer: Self-pay | Admitting: Family Medicine

## 2024-01-13 ENCOUNTER — Encounter: Payer: Self-pay | Admitting: Family Medicine

## 2024-01-13 ENCOUNTER — Telehealth (INDEPENDENT_AMBULATORY_CARE_PROVIDER_SITE_OTHER): Payer: Managed Care, Other (non HMO) | Admitting: Family Medicine

## 2024-01-13 DIAGNOSIS — F902 Attention-deficit hyperactivity disorder, combined type: Secondary | ICD-10-CM

## 2024-01-13 MED ORDER — METHYLPHENIDATE HCL ER (OSM) 27 MG PO TBCR
27.0000 mg | EXTENDED_RELEASE_TABLET | ORAL | 0 refills | Status: DC
Start: 2024-01-13 — End: 2024-02-11

## 2024-01-13 NOTE — Telephone Encounter (Signed)
 Called pt made her appt via Video.

## 2024-01-13 NOTE — Progress Notes (Signed)
   Virtual Visit via Video   I connected with patient on 01/13/24 at 10:00 AM EST by a video enabled telemedicine application and verified that I am speaking with the correct person using two identifiers.  Location patient: Home Location provider: [facility]on[facility], Office Persons participating in the virtual visit: Patient, Provider, CMA Archie Patten H)  I discussed the limitations of evaluation and management by telemedicine and the availability of in person appointments. The patient expressed understanding and agreed to proceed.  Subjective:   HPI:   ADHD- ongoing issue.  Currently on Concerta 18mg  daily.  Feels she tolerates this much better than the Adderall but is not as focused as she would like to be.  The first week she took the medication she had headaches but she was also starting her period.  She feels the HA's were more menstrual related than medication.  Would like to increase her dose.  ROS:   See pertinent positives and negatives per HPI.  Patient Active Problem List   Diagnosis Date Noted   Vitamin D deficiency 10/23/2020   Generalized anxiety disorder 05/15/2016   Irregular menses 05/15/2016   FH: colon cancer 02/25/2016   Anemia 11/13/2015   Attention deficit hyperactivity disorder (ADHD) 10/25/2015   Breast cancer screening 10/25/2015   Encounter for preventive health examination 10/25/2015   Obesity (BMI 30.0-34.9) 10/25/2015    Social History   Tobacco Use   Smoking status: Never   Smokeless tobacco: Never  Substance Use Topics   Alcohol use: No    Current Outpatient Medications:    albuterol (VENTOLIN HFA) 108 (90 Base) MCG/ACT inhaler, Inhale 2 puffs into the lungs every 6 (six) hours as needed for wheezing or shortness of breath., Disp: 8 g, Rfl: 0   amphetamine-dextroamphetamine (ADDERALL) 10 MG tablet, Take 1 tablet (10 mg total) by mouth 2 (two) times daily., Disp: 60 tablet, Rfl: 0   Cyanocobalamin (VITAMIN B 12 PO), Take by mouth., Disp: , Rfl:     escitalopram (LEXAPRO) 20 MG tablet, TAKE 1 TABLET (20 MG TOTAL) BY MOUTH DAILY. TAKE 20 MG BY MOUTH DAILY., Disp: 90 tablet, Rfl: 0   fluticasone (FLONASE) 50 MCG/ACT nasal spray, Place 2 sprays into both nostrils daily., Disp: 16 g, Rfl: 6   meclizine (ANTIVERT) 25 MG tablet, Take 1 tablet (25 mg total) by mouth 3 (three) times daily as needed for dizziness., Disp: 60 tablet, Rfl: 0   methylphenidate (CONCERTA) 18 MG PO CR tablet, Take 1 tablet (18 mg total) by mouth daily., Disp: 30 tablet, Rfl: 0   ondansetron (ZOFRAN) 4 MG tablet, Take 1 tablet (4 mg total) by mouth every 8 (eight) hours as needed for nausea or vomiting., Disp: 30 tablet, Rfl: 1   rosuvastatin (CRESTOR) 20 MG tablet, Take 1 tablet (20 mg total) by mouth daily., Disp: 30 tablet, Rfl: 3  No Known Allergies  Objective:   There were no vitals taken for this visit. AAOx3, NAD NCAT, EOMI No obvious CN deficits Coloring WNL Pt is able to speak clearly, coherently without shortness of breath or increased work of breathing.  Thought process is linear.  Mood is appropriate.   Assessment and Plan:   ADHD- pt feels better on the Concerta than she did on the Adderall.  Less jittery.  She feels like she needs to increase the dose of Concerta for better focus.  New prescription sent.   Neena Rhymes, MD 01/13/2024

## 2024-01-18 ENCOUNTER — Telehealth (INDEPENDENT_AMBULATORY_CARE_PROVIDER_SITE_OTHER): Payer: Managed Care, Other (non HMO) | Admitting: Family Medicine

## 2024-01-18 ENCOUNTER — Encounter: Payer: Self-pay | Admitting: Family Medicine

## 2024-01-18 DIAGNOSIS — B9689 Other specified bacterial agents as the cause of diseases classified elsewhere: Secondary | ICD-10-CM | POA: Diagnosis not present

## 2024-01-18 DIAGNOSIS — J329 Chronic sinusitis, unspecified: Secondary | ICD-10-CM

## 2024-01-18 NOTE — Progress Notes (Signed)
   Virtual Visit via Video   I connected with patient on 01/18/24 at  1:20 PM EST by a video enabled telemedicine application and verified that I am speaking with the correct person using two identifiers.  Location patient: Home Location provider: [facility]on[facility], Office Persons participating in the virtual visit: Patient, Provider, CMA Archie Patten H)  I discussed the limitations of evaluation and management by telemedicine and the availability of in person appointments. The patient expressed understanding and agreed to proceed.  Subjective:   HPI:   URI- 'i think I have a sinus infxn'.  sxs started Friday w/ head congestion, HA.  Slept all day yesterday.  Reports maxillary facial pain, tooth pain.  + fatigue.  Bilateral ear fullness.  Took home COVID/flu combo test and it was negative.  No N/V.  No fever.  Feels similar to previous sinus infxns.  ROS:   See pertinent positives and negatives per HPI.  Patient Active Problem List   Diagnosis Date Noted   Vitamin D deficiency 10/23/2020   Generalized anxiety disorder 05/15/2016   Irregular menses 05/15/2016   FH: colon cancer 02/25/2016   Anemia 11/13/2015   Attention deficit hyperactivity disorder (ADHD) 10/25/2015   Breast cancer screening 10/25/2015   Encounter for preventive health examination 10/25/2015   Obesity (BMI 30.0-34.9) 10/25/2015    Social History   Tobacco Use   Smoking status: Never   Smokeless tobacco: Never  Substance Use Topics   Alcohol use: No    Current Outpatient Medications:    albuterol (VENTOLIN HFA) 108 (90 Base) MCG/ACT inhaler, Inhale 2 puffs into the lungs every 6 (six) hours as needed for wheezing or shortness of breath., Disp: 8 g, Rfl: 0   amphetamine-dextroamphetamine (ADDERALL) 10 MG tablet, Take 1 tablet (10 mg total) by mouth 2 (two) times daily., Disp: 60 tablet, Rfl: 0   Cyanocobalamin (VITAMIN B 12 PO), Take by mouth., Disp: , Rfl:    escitalopram (LEXAPRO) 20 MG tablet, TAKE 1  TABLET (20 MG TOTAL) BY MOUTH DAILY. TAKE 20 MG BY MOUTH DAILY., Disp: 90 tablet, Rfl: 0   fluticasone (FLONASE) 50 MCG/ACT nasal spray, Place 2 sprays into both nostrils daily., Disp: 16 g, Rfl: 6   meclizine (ANTIVERT) 25 MG tablet, Take 1 tablet (25 mg total) by mouth 3 (three) times daily as needed for dizziness., Disp: 60 tablet, Rfl: 0   methylphenidate (CONCERTA) 27 MG PO CR tablet, Take 1 tablet (27 mg total) by mouth every morning., Disp: 30 tablet, Rfl: 0   ondansetron (ZOFRAN) 4 MG tablet, Take 1 tablet (4 mg total) by mouth every 8 (eight) hours as needed for nausea or vomiting., Disp: 30 tablet, Rfl: 1   rosuvastatin (CRESTOR) 20 MG tablet, Take 1 tablet (20 mg total) by mouth daily., Disp: 30 tablet, Rfl: 3  No Known Allergies  Objective:   There were no vitals taken for this visit. AAOx3, NAD NCAT, EOMI No obvious CN deficits Coloring WNL Pt is able to speak clearly, coherently without shortness of breath or increased work of breathing.  Thought process is linear.  Mood is appropriate.   Assessment and Plan:   Bacterial sinusitis- pt has hx of recurrent sinusitis.  Start Amoxicillin.  Reviewed supportive care and red flags that should prompt return.  Pt expressed understanding and is in agreement w/ plan.    Neena Rhymes, MD 01/18/2024

## 2024-01-19 ENCOUNTER — Encounter: Payer: Self-pay | Admitting: Family Medicine

## 2024-01-19 MED ORDER — AMOXICILLIN 875 MG PO TABS: 875.0000 mg | ORAL_TABLET | Freq: Two times a day (BID) | ORAL | 0 refills | Status: DC

## 2024-01-19 NOTE — Telephone Encounter (Signed)
 Please send Amoxicillin that was recommended during yesterdays appt

## 2024-01-28 ENCOUNTER — Ambulatory Visit: Payer: Self-pay | Admitting: Family Medicine

## 2024-01-28 NOTE — Telephone Encounter (Signed)
  Chief Complaint: chest pain . Fatigue  Symptoms: chest tightness comes and goes. Headache , sneezing. Fatigue . Slept 20 hours . One minute fine and next feels extreme tiredness Frequency: approx. 1 month  Pertinent Negatives: Patient denies chest pain with difficulty breathing no chest pain or tightness now .  no fever no sweating no dizziness. Disposition: [] ED /[] Urgent Care (no appt availability in office) / [x] Appointment(In office/virtual)/ []  [facility]/ [] Home Care/ [] Refused Recommended Disposition /[] [facility]/ []  Follow-up with PCP Additional Notes:   Appt scheduled for tomorrow. Recommended if sx worsen go to ED.    Copied from CRM [id number]. Topic: Clinical - Red Word Triage >> Jan 28, 2024  1:09 PM Lovey Newcomer R wrote: Red Word that prompted transfer to Nurse Triage: Chest tightness as if someone is sitting on her chest, chronic fatigue. For over a month now. Already has an acute appt that was set but too far out. Looking to come in sooner. Reason for Disposition  [1] Chest pain lasts < 5 minutes AND [2] NO chest pain or cardiac symptoms (e.g., breathing difficulty, sweating) now  (Exception: Chest pains that last only a few seconds.)  Answer Assessment - Initial Assessment Questions 1. LOCATION: "Where does it hurt?"       Middle  2. RADIATION: "Does the pain go anywhere else?" (e.g., into neck, jaw, arms, back)     No  3. ONSET: "When did the chest pain begin?" (Minutes, hours or days)      1 month ago  4. PATTERN: "Does the pain come and go, or has it been constant since it started?"  "Does it get worse with exertion?"      Comes and goes  5. DURATION: "How long does it last" (e.g., seconds, minutes, hours)     Feels like need to clear chest  6. SEVERITY: "How bad is the pain?"  (e.g., Scale 1-10; mild, moderate, or severe)    - MILD (1-3): doesn't interfere with normal activities     - MODERATE (4-7): interferes with normal activities or awakens  from sleep    - SEVERE (8-10): excruciating pain, unable to do any normal activities       No pain now  7. CARDIAC RISK FACTORS: "Do you have any history of heart problems or risk factors for heart disease?" (e.g., angina, prior heart attack; diabetes, high blood pressure, high cholesterol, smoker, or strong family history of heart disease)     Family hx heart issues 8. PULMONARY RISK FACTORS: "Do you have any history of lung disease?"  (e.g., blood clots in lung, asthma, emphysema, birth control pills)     Hx bronchitis and pneumonia  9. CAUSE: "What do you think is causing the chest pain?"     congestion 10. OTHER SYMPTOMS: "Do you have any other symptoms?" (e.g., dizziness, nausea, vomiting, sweating, fever, difficulty breathing, cough)       Fatigue , chest pain like something sitting on chest  headache sneezing at times.   11. PREGNANCY: "Is there any chance you are pregnant?" "When was your last menstrual period?"       na  Protocols used: Chest Pain-A-AH

## 2024-01-29 ENCOUNTER — Encounter: Payer: Self-pay | Admitting: Family Medicine

## 2024-01-29 ENCOUNTER — Ambulatory Visit: Admitting: Family Medicine

## 2024-01-29 VITALS — BP 120/76 | HR 83 | Temp 98.2°F | Wt 214.0 lb

## 2024-01-29 DIAGNOSIS — R079 Chest pain, unspecified: Secondary | ICD-10-CM

## 2024-01-29 DIAGNOSIS — R5383 Other fatigue: Secondary | ICD-10-CM

## 2024-01-29 LAB — B12 AND FOLATE PANEL
Folate: 11.4 ng/mL (ref >5.9)
Vitamin B-12: 401 pg/mL (ref 211–911)

## 2024-01-29 LAB — CBC WITH DIFFERENTIAL/PLATELET
Basophils Absolute: 0 10*3/uL (ref 0.0–0.1)
Basophils Relative: 0.6 % (ref 0.0–3.0)
Eosinophils Absolute: 0.1 10*3/uL (ref 0.0–0.7)
Eosinophils Relative: 1 % (ref 0.0–5.0)
HCT: 39.9 % (ref 36.0–46.0)
Hemoglobin: 13 g/dL (ref 12.0–15.0)
Lymphocytes Relative: 28.3 % (ref 12.0–46.0)
Lymphs Abs: 2.3 10*3/uL (ref 0.7–4.0)
MCHC: 32.5 g/dL (ref 30.0–36.0)
MCV: 83 fl (ref 78.0–100.0)
Monocytes Absolute: 0.5 10*3/uL (ref 0.1–1.0)
Monocytes Relative: 6 % (ref 3.0–12.0)
Neutro Abs: 5.1 10*3/uL (ref 1.4–7.7)
Neutrophils Relative %: 64.1 % (ref 43.0–77.0)
Platelets: 335 10*3/uL (ref 150.0–400.0)
RBC: 4.81 Mil/uL (ref 3.87–5.11)
RDW: 15.9 % — ABNORMAL HIGH (ref 11.5–15.5)
WBC: 7.9 10*3/uL (ref 4.0–10.5)

## 2024-01-29 LAB — HEPATIC FUNCTION PANEL
ALT: 15 U/L (ref 0–35)
AST: 18 U/L (ref 0–37)
Albumin: 4.7 g/dL (ref 3.5–5.2)
Alkaline Phosphatase: 56 U/L (ref 39–117)
Bilirubin, Direct: 0.1 mg/dL (ref 0.0–0.3)
Total Bilirubin: 0.4 mg/dL (ref 0.2–1.2)
Total Protein: 7.8 g/dL (ref 6.0–8.3)

## 2024-01-29 LAB — BASIC METABOLIC PANEL
BUN: 11 mg/dL (ref 6–23)
CO2: 24 meq/L (ref 19–32)
Calcium: 9.4 mg/dL (ref 8.4–10.5)
Chloride: 101 meq/L (ref 96–112)
Creatinine, Ser: 0.76 mg/dL (ref 0.40–1.20)
GFR: 92.01 mL/min (ref >60.00)
Glucose, Bld: 73 mg/dL (ref 70–99)
Potassium: 4 meq/L (ref 3.5–5.1)
Sodium: 138 meq/L (ref 135–145)

## 2024-01-29 LAB — LIPID PANEL
Cholesterol: 251 mg/dL — ABNORMAL HIGH (ref 0–200)
HDL: 45.8 mg/dL (ref >39.00)
LDL Cholesterol: 143 mg/dL — ABNORMAL HIGH (ref 0–99)
NonHDL: 205.23
Total CHOL/HDL Ratio: 5
Triglycerides: 313 mg/dL — ABNORMAL HIGH (ref 0.0–149.0)
VLDL: 62.6 mg/dL — ABNORMAL HIGH (ref 0.0–40.0)

## 2024-01-29 LAB — VITAMIN D 25 HYDROXY (VIT D DEFICIENCY, FRACTURES): VITD: 25.99 ng/mL — ABNORMAL LOW (ref 30.00–100.00)

## 2024-01-29 LAB — TSH: TSH: 2.17 u[IU]/mL (ref 0.35–5.50)

## 2024-01-29 MED ORDER — AIRSUPRA 90-80 MCG/ACT IN AERO: 2.0000 | INHALATION_SPRAY | RESPIRATORY_TRACT | 1 refills | Status: AC | PRN

## 2024-01-29 NOTE — Patient Instructions (Signed)
 Follow up as needed or as scheduled We'll notify you of your lab results and make any changes if needed START the Airsupra in place of the Albuterol (use the coupon!!) Make sure you are drinking LOTS of fluids REST! Call with any questions or concerns- or if symptoms change Hang in there!!!

## 2024-01-29 NOTE — Progress Notes (Signed)
   Subjective:    Patient ID: Jenny Little, female    DOB: 10-05-1976, 48 y.o.   MRN: [id number]  HPI Chest pain- pt reports she has been sick more frequently and feels tired 'all the time'.  She has been going to the gym regularly but stamina has not improved.  Pt reports sleeping well.  'it feels like something is in my chest'.  Used her inhaler last night and 'it felt like something was breaking up'.  Occasional SOB.     Review of Systems For ROS see HPI     Objective:   Physical Exam Vitals reviewed.  Constitutional:      General: She is not in acute distress.    Appearance: She is not ill-appearing.  HENT:     Head: Normocephalic and atraumatic.     Right Ear: Tympanic membrane and ear canal normal.     Left Ear: Tympanic membrane and ear canal normal.     Nose: Congestion present. No rhinorrhea.  Eyes:     Extraocular Movements: Extraocular movements intact.  Cardiovascular:     Rate and Rhythm: Normal rate and regular rhythm.  Pulmonary:     Effort: Pulmonary effort is normal.     Breath sounds: Decreased breath sounds and wheezing (scattered expiratory) present. No rhonchi.  Musculoskeletal:     Cervical back: Normal range of motion.  Lymphadenopathy:     Cervical: No cervical adenopathy.  Skin:    General: Skin is warm and dry.  Neurological:     General: No focal deficit present.     Mental Status: She is alert and oriented to person, place, and time.  Psychiatric:     Comments: Tearful, overwhelmed, anxious           Assessment & Plan:  Fatigue- new.  Pt reports a few weeks of near constant fatigue.  She reports she is sleeping well, is making herself go to the gym, but she feels exhausted.  They are short staffed at work and she is putting in a lot of hours.  She feels she has been getting sick more often which is not helping her fatigue.  She denies depression but is tearful in office today.  Check labs to r/o metabolic causes.  Discussed the need to slow  down- particularly at work- b/c she is wearing herself out.  Chest pain- new.  Pt denies true pain but feels that there is either a tightness or 'something in there'.  Some relief w/ Albuterol inhaler.  She has had multiple URI's recently.  EKG unchanged from previous and she does not have CP w/ working out or exertion.  Will switch Albuterol to Airsupra and monitor for improvement.  Pt expressed understanding and is in agreement w/ plan.

## 2024-01-31 ENCOUNTER — Encounter: Payer: Self-pay | Admitting: Family Medicine

## 2024-01-31 DIAGNOSIS — E559 Vitamin D deficiency, unspecified: Secondary | ICD-10-CM

## 2024-01-31 LAB — ANA: Anti Nuclear Antibody (ANA): NEGATIVE

## 2024-02-01 MED ORDER — VITAMIN D (ERGOCALCIFEROL) 1.25 MG (50000 UNIT) PO CAPS: 50000.0000 [IU] | ORAL_CAPSULE | ORAL | 0 refills | Status: DC

## 2024-02-01 NOTE — Telephone Encounter (Signed)
Patient has questions about lab results. Please advise.

## 2024-02-11 ENCOUNTER — Encounter: Payer: Self-pay | Admitting: Family Medicine

## 2024-02-11 ENCOUNTER — Ambulatory Visit: Admitting: Family Medicine

## 2024-02-11 MED ORDER — METHYLPHENIDATE HCL ER (OSM) 27 MG PO TBCR: 27.0000 mg | EXTENDED_RELEASE_TABLET | ORAL | 0 refills | Status: DC

## 2024-02-11 NOTE — Telephone Encounter (Signed)
 Requested Prescriptions   Pending Prescriptions Disp Refills   methylphenidate (CONCERTA) 27 MG PO CR tablet 30 tablet 0    Sig: Take 1 tablet (27 mg total) by mouth every morning.     Date of patient request: 3/20/205 Last office visit: 01/29/2024 Upcoming visit: Visit date not found Date of last refill: 01/13/2024 Last refill amount: 330 0 refills

## 2024-03-03 ENCOUNTER — Other Ambulatory Visit: Payer: Self-pay | Admitting: Family Medicine

## 2024-03-03 DIAGNOSIS — E785 Hyperlipidemia, unspecified: Secondary | ICD-10-CM

## 2024-03-07 ENCOUNTER — Encounter: Payer: Self-pay | Admitting: Family Medicine

## 2024-03-07 MED ORDER — METHYLPHENIDATE HCL ER (OSM) 27 MG PO TBCR: 27.0000 mg | EXTENDED_RELEASE_TABLET | ORAL | 0 refills | Status: DC

## 2024-03-07 NOTE — Telephone Encounter (Signed)
 Requested Prescriptions   Pending Prescriptions Disp Refills   methylphenidate (CONCERTA) 27 MG PO CR tablet 30 tablet 0    Sig: Take 1 tablet (27 mg total) by mouth every morning.     Date of patient request: 03/07/2024 Last office visit: 01/29/2024 Upcoming visit: Visit date not found Date of last refill: 02/11/2024 Last refill amount: 30

## 2024-03-26 ENCOUNTER — Other Ambulatory Visit: Payer: Self-pay | Admitting: Family Medicine

## 2024-03-31 ENCOUNTER — Telehealth (INDEPENDENT_AMBULATORY_CARE_PROVIDER_SITE_OTHER): Admitting: Family Medicine

## 2024-03-31 ENCOUNTER — Encounter: Payer: Self-pay | Admitting: Family Medicine

## 2024-03-31 DIAGNOSIS — F902 Attention-deficit hyperactivity disorder, combined type: Secondary | ICD-10-CM | POA: Diagnosis not present

## 2024-03-31 MED ORDER — METHYLPHENIDATE HCL ER (OSM) 27 MG PO TBCR: 27.0000 mg | EXTENDED_RELEASE_TABLET | Freq: Two times a day (BID) | ORAL | 0 refills | Status: DC

## 2024-03-31 NOTE — Progress Notes (Signed)
 Virtual Visit via Video   I connected with patient on 03/31/24 at  1:00 PM EDT by a video enabled telemedicine application and verified that I am speaking with the correct person using two identifiers.  Location patient: Home Location provider: [facility], Office Persons participating in the virtual visit: Patient, Provider, CMA Lynnie Saucier H)  I discussed the limitations of evaluation and management by telemedicine and the availability of in person appointments. The patient expressed understanding and agreed to proceed.  Subjective:   HPI:   ADHD- pt reports Concerta  has not been as effective as it needs to be.  It's not lasting long enough.  For the past few days she has been taking her usual dose at 5am upon waking and then will take a 2nd tab around 11am.  Pt reports this is getting her through the day, not having any palpitations or increased anxiety.    ROS:   See pertinent positives and negatives per HPI.  Patient Active Problem List   Diagnosis Date Noted   Vitamin D  deficiency 10/23/2020   Generalized anxiety disorder 05/15/2016   Irregular menses 05/15/2016   FH: colon cancer 02/25/2016   Anemia 11/13/2015   Attention deficit hyperactivity disorder (ADHD) 10/25/2015   Breast cancer screening 10/25/2015   Encounter for preventive health examination 10/25/2015   Obesity (BMI 30.0-34.9) 10/25/2015    Social History   Tobacco Use   Smoking status: Never   Smokeless tobacco: Never  Substance Use Topics   Alcohol use: No    Current Outpatient Medications:    albuterol  (VENTOLIN  HFA) 108 (90 Base) MCG/ACT inhaler, Inhale 2 puffs into the lungs every 6 (six) hours as needed for wheezing or shortness of breath., Disp: 8 g, Rfl: 0   Albuterol -Budesonide (AIRSUPRA ) 90-80 MCG/ACT AERO, Inhale 2 puffs into the lungs every 4 (four) hours as needed., Disp: 17.7 g, Rfl: 1   Cyanocobalamin  (VITAMIN B 12 PO), Take by mouth., Disp: , Rfl:    escitalopram  (LEXAPRO ) 20 MG  tablet, TAKE 1 TABLET (20 MG TOTAL) BY MOUTH DAILY. TAKE 20 MG BY MOUTH DAILY., Disp: 90 tablet, Rfl: 0   fluticasone  (FLONASE ) 50 MCG/ACT nasal spray, Place 2 sprays into both nostrils daily., Disp: 16 g, Rfl: 6   meclizine  (ANTIVERT ) 25 MG tablet, Take 1 tablet (25 mg total) by mouth 3 (three) times daily as needed for dizziness., Disp: 60 tablet, Rfl: 0   methylphenidate  (CONCERTA ) 27 MG PO CR tablet, Take 1 tablet (27 mg total) by mouth every morning., Disp: 30 tablet, Rfl: 0   ondansetron  (ZOFRAN ) 4 MG tablet, Take 1 tablet (4 mg total) by mouth every 8 (eight) hours as needed for nausea or vomiting., Disp: 30 tablet, Rfl: 1   rosuvastatin  (CRESTOR ) 20 MG tablet, TAKE 1 TABLET BY MOUTH EVERY DAY, Disp: 90 tablet, Rfl: 1   Vitamin D , Ergocalciferol , (DRISDOL ) 1.25 MG (50000 UNIT) CAPS capsule, Take 1 capsule (50,000 Units total) by mouth every 7 (seven) days., Disp: 12 capsule, Rfl: 0  No Known Allergies  Objective:   There were no vitals taken for this visit. AAOx3, NAD NCAT, EOMI No obvious CN deficits Coloring WNL Pt is able to speak clearly, coherently without shortness of breath or increased work of breathing.  Thought process is linear.  Mood is appropriate.   Assessment and Plan:   ADHD- deteriorated.  Pt reports her Concerta  was not working as well and she has having a hard time w/ sustained focus.  She found that if  she takes a 2nd pill later in the day, she does much better and is able to perform at work and home w/o any side effects.  Prescription updated to reflect change in dosing.   Laymon Priest, MD 03/31/2024

## 2024-05-06 ENCOUNTER — Other Ambulatory Visit: Payer: Self-pay | Admitting: Family Medicine

## 2024-05-06 DIAGNOSIS — E559 Vitamin D deficiency, unspecified: Secondary | ICD-10-CM

## 2024-05-09 ENCOUNTER — Encounter: Payer: Self-pay | Admitting: Family Medicine

## 2024-05-10 MED ORDER — METHYLPHENIDATE HCL ER (OSM) 27 MG PO TBCR: 27.0000 mg | EXTENDED_RELEASE_TABLET | Freq: Two times a day (BID) | ORAL | 0 refills | Status: DC

## 2024-05-10 NOTE — Telephone Encounter (Signed)
 Requested Prescriptions   Pending Prescriptions Disp Refills   methylphenidate  (CONCERTA ) 27 MG PO CR tablet 60 tablet 0    Sig: Take 1 tablet (27 mg total) by mouth 2 (two) times daily. Take 1 upon waking and a 2nd early afternoon for additional/extended focus     Date of patient request: 05/09/2024 Last office visit: 01/29/2024 Upcoming visit: Visit date not found Date of last refill: 03/31/2024 Last refill amount: 60

## 2024-06-07 ENCOUNTER — Encounter: Payer: Self-pay | Admitting: Family Medicine

## 2024-06-07 MED ORDER — METHYLPHENIDATE HCL ER (OSM) 27 MG PO TBCR: 27.0000 mg | EXTENDED_RELEASE_TABLET | Freq: Two times a day (BID) | ORAL | 0 refills | Status: DC

## 2024-06-07 NOTE — Telephone Encounter (Signed)
 Requested Prescriptions   Pending Prescriptions Disp Refills   methylphenidate  (CONCERTA ) 27 MG PO CR tablet 60 tablet 0    Sig: Take 1 tablet (27 mg total) by mouth 2 (two) times daily. Take 1 upon waking and a 2nd early afternoon for additional/extended focus     Date of patient request: 06/07/2024 Last office visit: 01/29/2024 Upcoming visit: Visit date not found Date of last refill: 05/10/2024 Last refill amount: 60

## 2024-07-07 ENCOUNTER — Encounter: Payer: Self-pay | Admitting: Family Medicine

## 2024-07-07 MED ORDER — METHYLPHENIDATE HCL ER (OSM) 27 MG PO TBCR: 27.0000 mg | EXTENDED_RELEASE_TABLET | Freq: Two times a day (BID) | ORAL | 0 refills | Status: DC

## 2024-07-07 NOTE — Telephone Encounter (Signed)
 Requested Prescriptions   Pending Prescriptions Disp Refills   methylphenidate  (CONCERTA ) 27 MG PO CR tablet 60 tablet 0    Sig: Take 1 tablet (27 mg total) by mouth 2 (two) times daily. Take 1 upon waking and a 2nd early afternoon for additional/extended focus     Date of patient request: 07/07/2024 Last office visit: 01/29/2024 Upcoming visit: Visit date not found Date of last refill: 06/07/2024 Last refill amount: 60

## 2024-07-13 ENCOUNTER — Telehealth: Admitting: Family Medicine

## 2024-07-13 DIAGNOSIS — U071 COVID-19: Secondary | ICD-10-CM

## 2024-07-13 MED ORDER — NIRMATRELVIR/RITONAVIR (PAXLOVID)TABLET: 3.0000 | ORAL_TABLET | Freq: Two times a day (BID) | ORAL | 0 refills | Status: AC

## 2024-07-13 NOTE — Progress Notes (Unsigned)
 Virtual Visit via Video   I connected with patient on 07/13/24 at  2:20 PM EDT by a video enabled telemedicine application and verified that I am speaking with the correct person using two identifiers.  Location patient: Home Location provider: [facility]on[facility], Office Persons participating in the virtual visit: Patient, Provider, CMA (Meighan D)  I discussed the limitations of evaluation and management by telemedicine and the availability of in person appointments. The patient expressed understanding and agreed to proceed.  Subjective:   HPI:   URI- sxs started late last week w/ congestion, sinus pressure.  Yesterday slept all day.  + fatigue, 'real weak', L ear pain, mild cough.  Denies body aches.  No fever.  Last week had HA and vertigo.  + maxillary pain.  Took flu/COVID test yesterday- negative.  ROS:   See pertinent positives and negatives per HPI.  Patient Active Problem List   Diagnosis Date Noted   Vitamin D  deficiency 10/23/2020   Generalized anxiety disorder 05/15/2016   Irregular menses 05/15/2016   FH: colon cancer 02/25/2016   Anemia 11/13/2015   Attention deficit hyperactivity disorder (ADHD) 10/25/2015   Breast cancer screening 10/25/2015   Encounter for preventive health examination 10/25/2015   Obesity (BMI 30.0-34.9) 10/25/2015    Social History   Tobacco Use   Smoking status: Never   Smokeless tobacco: Never  Substance Use Topics   Alcohol use: No    Current Outpatient Medications:    albuterol  (VENTOLIN  HFA) 108 (90 Base) MCG/ACT inhaler, Inhale 2 puffs into the lungs every 6 (six) hours as needed for wheezing or shortness of breath., Disp: 8 g, Rfl: 0   Albuterol -Budesonide (AIRSUPRA ) 90-80 MCG/ACT AERO, Inhale 2 puffs into the lungs every 4 (four) hours as needed., Disp: 17.7 g, Rfl: 1   Cyanocobalamin  (VITAMIN B 12 PO), Take by mouth., Disp: , Rfl:    escitalopram  (LEXAPRO ) 20 MG tablet, TAKE 1 TABLET (20 MG TOTAL) BY MOUTH DAILY. TAKE 20  MG BY MOUTH DAILY., Disp: 90 tablet, Rfl: 0   fluticasone  (FLONASE ) 50 MCG/ACT nasal spray, Place 2 sprays into both nostrils daily., Disp: 16 g, Rfl: 6   meclizine  (ANTIVERT ) 25 MG tablet, Take 1 tablet (25 mg total) by mouth 3 (three) times daily as needed for dizziness., Disp: 60 tablet, Rfl: 0   methylphenidate  (CONCERTA ) 27 MG PO CR tablet, Take 1 tablet (27 mg total) by mouth 2 (two) times daily. Take 1 upon waking and a 2nd early afternoon for additional/extended focus, Disp: 60 tablet, Rfl: 0   ondansetron  (ZOFRAN ) 4 MG tablet, Take 1 tablet (4 mg total) by mouth every 8 (eight) hours as needed for nausea or vomiting., Disp: 30 tablet, Rfl: 1   rosuvastatin  (CRESTOR ) 20 MG tablet, TAKE 1 TABLET BY MOUTH EVERY DAY, Disp: 90 tablet, Rfl: 1   Vitamin D , Ergocalciferol , (DRISDOL ) 1.25 MG (50000 UNIT) CAPS capsule, TAKE 1 CAPSULE (50,000 UNITS TOTAL) BY MOUTH EVERY 7 (SEVEN) DAYS, Disp: 4 capsule, Rfl: 2  No Known Allergies  Objective:   There were no vitals taken for this visit. AAOx3, NAD NCAT, EOMI No obvious CN deficits Coloring WNL Pt is able to speak clearly, coherently without shortness of breath or increased work of breathing.  Thought process is linear.  Mood is appropriate.   Assessment and Plan:   COVID- new.  Pt's sxs are consistent w/ illness despite negative home test.  She admits that this is not similar to previous sinus infxns.  Will start Paxlovid .  Reviewed supportive care and red flags that should prompt return.  Pt expressed understanding and is in agreement w/ plan.    Comer Greet, MD 07/13/2024

## 2024-07-26 ENCOUNTER — Ambulatory Visit: Admitting: Family Medicine

## 2024-07-26 ENCOUNTER — Telehealth (INDEPENDENT_AMBULATORY_CARE_PROVIDER_SITE_OTHER): Admitting: Family Medicine

## 2024-07-26 ENCOUNTER — Encounter: Payer: Self-pay | Admitting: Family Medicine

## 2024-07-26 DIAGNOSIS — F411 Generalized anxiety disorder: Secondary | ICD-10-CM | POA: Diagnosis not present

## 2024-07-26 MED ORDER — BUPROPION HCL ER (XL) 150 MG PO TB24: 150.0000 mg | ORAL_TABLET | Freq: Every day | ORAL | 3 refills | Status: DC

## 2024-07-26 NOTE — Progress Notes (Signed)
 Virtual Visit via Video   I connected with patient on 07/26/24 at  8:40 AM EDT by a video enabled telemedicine application and verified that I am speaking with the correct person using two identifiers.  Location patient: Home Location provider: [facility]on[facility], Office Persons participating in the virtual visit: Patient, Provider, CMA (Tonya H)  I discussed the limitations of evaluation and management by telemedicine and the availability of in person appointments. The patient expressed understanding and agreed to proceed.  Subjective:   HPI:   Irritability- currently on Lexapro  20mg  daily.  'i feel like I'm a going crazy'.  'i feel like I can just snap'.  Pt notes that there are days she wakes up in a 'pure rage'.  Occurring prior to menstrual cycles.  Pt reports she can be in a good mood and then starts to feel anxiety start.    ROS:   See pertinent positives and negatives per HPI.  Patient Active Problem List   Diagnosis Date Noted   Vitamin D  deficiency 10/23/2020   Generalized anxiety disorder 05/15/2016   Irregular menses 05/15/2016   FH: colon cancer 02/25/2016   Anemia 11/13/2015   Attention deficit hyperactivity disorder (ADHD) 10/25/2015   Breast cancer screening 10/25/2015   Encounter for preventive health examination 10/25/2015   Obesity (BMI 30.0-34.9) 10/25/2015    Social History   Tobacco Use   Smoking status: Never   Smokeless tobacco: Never  Substance Use Topics   Alcohol use: No    Current Outpatient Medications:    albuterol  (VENTOLIN  HFA) 108 (90 Base) MCG/ACT inhaler, Inhale 2 puffs into the lungs every 6 (six) hours as needed for wheezing or shortness of breath., Disp: 8 g, Rfl: 0   Albuterol -Budesonide (AIRSUPRA ) 90-80 MCG/ACT AERO, Inhale 2 puffs into the lungs every 4 (four) hours as needed., Disp: 17.7 g, Rfl: 1   Cyanocobalamin  (VITAMIN B 12 PO), Take by mouth., Disp: , Rfl:    escitalopram  (LEXAPRO ) 20 MG tablet, TAKE 1 TABLET (20 MG  TOTAL) BY MOUTH DAILY. TAKE 20 MG BY MOUTH DAILY., Disp: 90 tablet, Rfl: 0   fluticasone  (FLONASE ) 50 MCG/ACT nasal spray, Place 2 sprays into both nostrils daily., Disp: 16 g, Rfl: 6   meclizine  (ANTIVERT ) 25 MG tablet, Take 1 tablet (25 mg total) by mouth 3 (three) times daily as needed for dizziness., Disp: 60 tablet, Rfl: 0   methylphenidate  (CONCERTA ) 27 MG PO CR tablet, Take 1 tablet (27 mg total) by mouth 2 (two) times daily. Take 1 upon waking and a 2nd early afternoon for additional/extended focus, Disp: 60 tablet, Rfl: 0   ondansetron  (ZOFRAN ) 4 MG tablet, Take 1 tablet (4 mg total) by mouth every 8 (eight) hours as needed for nausea or vomiting., Disp: 30 tablet, Rfl: 1   rosuvastatin  (CRESTOR ) 20 MG tablet, TAKE 1 TABLET BY MOUTH EVERY DAY, Disp: 90 tablet, Rfl: 1   Vitamin D , Ergocalciferol , (DRISDOL ) 1.25 MG (50000 UNIT) CAPS capsule, TAKE 1 CAPSULE (50,000 UNITS TOTAL) BY MOUTH EVERY 7 (SEVEN) DAYS, Disp: 4 capsule, Rfl: 2  No Known Allergies  Objective:   There were no vitals taken for this visit. AAOx3, NAD NCAT, EOMI No obvious CN deficits Coloring WNL Pt is able to speak clearly, coherently without shortness of breath or increased work of breathing.  Thought process is linear.  Mood is appropriate.   Assessment and Plan:   GAD- deteriorated.  Waking up feeling very irritable.  States some days 'i don't even like myself'.  Will add Wellbutrin  daily and continue her Lexapro .  Will avoid Alprazolam as pt took it previously and reports 'i popped that like candy'.  She will keep me updated on how she is feeling.   Comer Greet, MD 07/26/2024

## 2024-07-29 ENCOUNTER — Encounter: Payer: Self-pay | Admitting: Family Medicine

## 2024-07-29 NOTE — Telephone Encounter (Signed)
 I see that patient was currently on lexapro  and was switched to [place], they are experiencing agitation since starting this and would like advice on what they should do. They had a televisit on 9/2, would you like to have another visit with them to discuss this?

## 2024-08-05 ENCOUNTER — Ambulatory Visit: Payer: Self-pay

## 2024-08-05 ENCOUNTER — Other Ambulatory Visit: Payer: Self-pay | Admitting: Family Medicine

## 2024-08-05 ENCOUNTER — Ambulatory Visit: Admitting: Family Medicine

## 2024-08-05 ENCOUNTER — Encounter: Payer: Self-pay | Admitting: Family Medicine

## 2024-08-05 VITALS — BP 118/78 | HR 74 | Temp 97.8°F | Ht 68.0 in | Wt 231.8 lb

## 2024-08-05 DIAGNOSIS — R5383 Other fatigue: Secondary | ICD-10-CM

## 2024-08-05 DIAGNOSIS — J3489 Other specified disorders of nose and nasal sinuses: Secondary | ICD-10-CM

## 2024-08-05 DIAGNOSIS — R5381 Other malaise: Secondary | ICD-10-CM

## 2024-08-05 DIAGNOSIS — H6992 Unspecified Eustachian tube disorder, left ear: Secondary | ICD-10-CM | POA: Diagnosis not present

## 2024-08-05 DIAGNOSIS — R42 Dizziness and giddiness: Secondary | ICD-10-CM

## 2024-08-05 DIAGNOSIS — H938X2 Other specified disorders of left ear: Secondary | ICD-10-CM

## 2024-08-05 DIAGNOSIS — Z87898 Personal history of other specified conditions: Secondary | ICD-10-CM | POA: Diagnosis not present

## 2024-08-05 DIAGNOSIS — J0100 Acute maxillary sinusitis, unspecified: Secondary | ICD-10-CM | POA: Diagnosis not present

## 2024-08-05 DIAGNOSIS — E559 Vitamin D deficiency, unspecified: Secondary | ICD-10-CM

## 2024-08-05 LAB — CBC
HCT: 38.6 % (ref 36.0–46.0)
Hemoglobin: 12.3 g/dL (ref 12.0–15.0)
MCHC: 31.9 g/dL (ref 30.0–36.0)
MCV: 79.1 fl (ref 78.0–100.0)
Platelets: 342 K/uL (ref 150.0–400.0)
RBC: 4.89 Mil/uL (ref 3.87–5.11)
RDW: 16.9 % — ABNORMAL HIGH (ref 11.5–15.5)
WBC: 10.5 K/uL (ref 4.0–10.5)

## 2024-08-05 LAB — BASIC METABOLIC PANEL WITH GFR
BUN: 13 mg/dL (ref 6–23)
CO2: 24 meq/L (ref 19–32)
Calcium: 9.1 mg/dL (ref 8.4–10.5)
Chloride: 102 meq/L (ref 96–112)
Creatinine, Ser: 0.77 mg/dL (ref 0.40–1.20)
GFR: 90.25 mL/min (ref >60.00)
Glucose, Bld: 73 mg/dL (ref 70–99)
Potassium: 4.1 meq/L (ref 3.5–5.1)
Sodium: 136 meq/L (ref 135–145)

## 2024-08-05 LAB — POCT INFLUENZA A/B
Influenza A, POC: NEGATIVE
Influenza B, POC: NEGATIVE

## 2024-08-05 LAB — TSH: TSH: 3.31 u[IU]/mL (ref 0.35–5.50)

## 2024-08-05 LAB — POC COVID19 BINAXNOW: SARS Coronavirus 2 Ag: NEGATIVE

## 2024-08-05 MED ORDER — AMOXICILLIN-POT CLAVULANATE 875-125 MG PO TABS: 1.0000 | ORAL_TABLET | Freq: Two times a day (BID) | ORAL | 0 refills | Status: DC

## 2024-08-05 MED ORDER — MECLIZINE HCL 25 MG PO TABS: 25.0000 mg | ORAL_TABLET | Freq: Three times a day (TID) | ORAL | 0 refills | Status: AC | PRN

## 2024-08-05 NOTE — Telephone Encounter (Signed)
 Pt can have a virtual visit if there are any available this afternoon (here or at another site) but unfortunately I don't have any availability

## 2024-08-05 NOTE — Telephone Encounter (Signed)
 Patient was seen by Dr. Levora today. Was prescribed abx. Sending as FYI

## 2024-08-05 NOTE — Telephone Encounter (Signed)
 FYI Only or Action Required?: Action required by provider: Refusing disposition, requesting virtual instead of in-person appt, needs call back asap.  Patient was last seen in primary care on 07/26/2024 by Mahlon Comer BRAVO, MD.  Called Nurse Triage reporting Dizziness, Fatigue, Nausea, Blurred Vision, Ear Fullness, and sinus and ear pressure.  Symptoms began several days ago.  Interventions attempted: Rest, hydration, or home remedies.  Symptoms are: gradually improving.  Triage Disposition: See Physician Within 24 Hours  Patient/caregiver understands and will follow disposition?: No, wishes to speak with PCP     Copied from CRM #[id number]. Topic: Clinical - Red Word Triage >> Aug 05, 2024  8:03 AM Adelita E wrote: [facility] that prompted transfer to Nurse Triage: Vertigo going on past couple days, patient cannot move head due to dizziness. Potential sinus infection, very weak. Reason for Disposition  Earache  Answer Assessment - Initial Assessment Questions 1. DESCRIPTION: Describe your dizziness.     As long as looking straight and don't move head around 2. VERTIGO: Do you feel like either you or the room is spinning or tilting?      Get it every now and then, dx'd in past 4. SEVERITY: How bad is it?  Can you walk?     Able to walk today, Wednesday really could not, Thursday as long as kept head very very focused, pretty bad this week 5. ONSET:  When did the dizziness begin?     Past couple  6. AGGRAVATING FACTORS: Does anything make it worse? (e.g., standing, change in head position)     Changing head position, little bit with standing 9. OTHER SYMPTOMS: Do you have any other symptoms? (e.g., earache, headache, numbness, tinnitus, vomiting, weakness)     Feel like left side of face impacted, pressure in sinuses Not too weak to stand, can still function today just wouldn't drive Pt cannot move head due to dizziness Wednesday was too weak to stand Not felt like  about to pass out Did feel nauseous real nauseous Lots of lines going in my eyes, like eye migraine, hx of migraine, don't see them now, don't feel like have had migraine though No issues with BP that aware of  Requesting antibx  Have to avoid missing work Ear fullness, no pain, just feels like lot of stuff in there Mainly left side of face As soon as turn left start spinning Confirms vertigo worse than her usual Feels weak like sick when moving around a lot like walking through yard Confirms no chest pain or SOB, no headache Confirms not muscle weakness   Advised pt go to ED or call back for any worsening, advised in-person evaluation asap, pt requesting virtual appt instead of in-person, sending message to PCP office with request.  Protocols used: Dizziness - Vertigo-A-AH

## 2024-08-05 NOTE — Patient Instructions (Signed)
 I suspect that you have a sinus infection from the persistent congestion, which also is likely impacting the eustachian tube, pressure in your ear and leading to vertigo.  That may also be contributing to your fatigue.  I will check some blood work today.  If heart palpitations return please be seen.  Start Augmentin  for sinus infection, meclizine  for vertigo but glad to hear those symptoms are improving.  Try to get some rest this weekend, drink plenty of fluids.  Be seen if not improving into next week, sooner if worsening but I do not expect that to occur.  Hang in there.

## 2024-08-05 NOTE — Progress Notes (Signed)
 Subjective:  Patient ID: Jenny Little, female    DOB: 12/23/1975  Age: 48 y.o. MRN: [id number]  CC:  Chief Complaint  Patient presents with   Dizziness    Started with vertigo on Wednesday, sinus pressure, pressure in Lt ear, fatigue     HPI Allyssia Skluzacek presents for  Acute visit for above.  PCP is Dr. Mahlon.   Dizziness, sinus pressure: Feels like left ear is full.  Covid infection few weeks ago - suspected covid infection. Had virtual visit. Had some persistent congestion. Returned to work, felt better, but increased workload, longer hours. Noticed vertigo - 2 days ago - room spinning. No syncope. Few heart palpitations. No chest pain. Stayed home. Took allegra D, felt slightly better yesterday. Still trouble with dizziness with turning head left or right. Still slight feeling today with head turning only. No vomiting. No fever.  Sick contacts at work.  This am - feels more weak than yesterday. More sinus pain, feels like sinus infection. Now congestion has turned into green mucus, more sinus pressure.  Headache behind eyes. No fever today.  Drinking fluids.  No return of palpitations. No new dyspnea. Just generalized fatigue. Sleeping ok.     History Patient Active Problem List   Diagnosis Date Noted   Vitamin D  deficiency 10/23/2020   Generalized anxiety disorder 05/15/2016   Irregular menses 05/15/2016   FH: colon cancer 02/25/2016   Anemia 11/13/2015   Attention deficit hyperactivity disorder (ADHD) 10/25/2015   Breast cancer screening 10/25/2015   Encounter for preventive health examination 10/25/2015   Obesity (BMI 30.0-34.9) 10/25/2015   Past Medical History:  Diagnosis Date   ADHD (attention deficit hyperactivity disorder)    Allergy    Anxiety    Depression    Fracture, toe    Iron deficiency anemia    Meningitis spinal    bacterial-child   Migraine    Past Surgical History:  Procedure Laterality Date   BREAST SURGERY  11/24/2000   implants  (saline)   No Known Allergies Prior to Admission medications   Medication Sig Start Date End Date Taking? Authorizing Provider  albuterol  (VENTOLIN  HFA) 108 (90 Base) MCG/ACT inhaler Inhale 2 puffs into the lungs every 6 (six) hours as needed for wheezing or shortness of breath. 12/23/23   Tabori, Katherine E, MD  Albuterol -Budesonide (AIRSUPRA ) 90-80 MCG/ACT AERO Inhale 2 puffs into the lungs every 4 (four) hours as needed. 01/29/24   Tabori, Katherine E, MD  buPROPion  (WELLBUTRIN  XL) 150 MG 24 hr tablet Take 1 tablet (150 mg total) by mouth daily. 07/26/24   Tabori, Katherine E, MD  Cyanocobalamin  (VITAMIN B 12 PO) Take by mouth.    [provider]  escitalopram  (LEXAPRO ) 20 MG tablet TAKE 1 TABLET (20 MG TOTAL) BY MOUTH DAILY. TAKE 20 MG BY MOUTH DAILY. 03/28/24   Tabori, Katherine E, MD  fluticasone  (FLONASE ) 50 MCG/ACT nasal spray Place 2 sprays into both nostrils daily. 11/25/22   Webb, Padonda B, FNP  meclizine  (ANTIVERT ) 25 MG tablet Take 1 tablet (25 mg total) by mouth 3 (three) times daily as needed for dizziness. 04/29/23   Mahlon Comer BRAVO, MD  methylphenidate  (CONCERTA ) 27 MG PO CR tablet Take 1 tablet (27 mg total) by mouth 2 (two) times daily. Take 1 upon waking and a 2nd early afternoon for additional/extended focus 07/07/24   Mahlon Comer BRAVO, MD  ondansetron  (ZOFRAN ) 4 MG tablet Take 1 tablet (4 mg total) by mouth every 8 (eight) hours as  needed for nausea or vomiting. 04/29/23   Mahlon Comer BRAVO, MD  rosuvastatin  (CRESTOR ) 20 MG tablet TAKE 1 TABLET BY MOUTH EVERY DAY 03/03/24   Tabori, Katherine E, MD  Vitamin D , Ergocalciferol , (DRISDOL ) 1.25 MG (50000 UNIT) CAPS capsule TAKE 1 CAPSULE (50,000 UNITS TOTAL) BY MOUTH EVERY 7 (SEVEN) DAYS 08/05/24   Mahlon Comer BRAVO, MD   Social History   Socioeconomic History   Marital status: Married    Spouse name: Not on file   Number of children: 2   Years of education: Not on file   Highest education level: Bachelor's degree  (e.g., BA, AB, BS)  Occupational History   Occupation: real estate agent  Tobacco Use   Smoking status: Never   Smokeless tobacco: Never  Vaping Use   Vaping status: Never Used  Substance and Sexual Activity   Alcohol use: No   Drug use: Never   Sexual activity: Yes    Comment: female   Other Topics Concern   Not on file  Social History Narrative   Married. Husband's name Alm. 2 children, Marolyn and Josue.   Bachelor's in social work. Employed full-time as a Customer service manager.   Moved to [facility] [facility] [place] November 2016.   Takes a daily vitamin, wears her seatbelt, wears a bike helmet, exercises at least 3 times a week.   Smoke detector located in the home.   No firearms in the home.   Patient feels safe in her relationship.   Social Drivers of Health   Financial Resource Strain: Patient Declined (07/13/2024)   Overall Financial Resource Strain (CARDIA)    Difficulty of Paying Living Expenses: Patient declined  Food Insecurity: Patient Declined (07/13/2024)   Hunger Vital Sign    Worried About Running Out of Food in the Last Year: Patient declined    Ran Out of Food in the Last Year: Patient declined  Transportation Needs: No Transportation Needs (07/13/2024)   PRAPARE - Administrator, Civil Service (Medical): No    Lack of Transportation (Non-Medical): No  Physical Activity: Insufficiently Active (07/13/2024)   Exercise Vital Sign    Days of Exercise per Week: 3 days    Minutes of Exercise per Session: 30 min  Stress: No Stress Concern Present (07/13/2024)   [employer] of Occupational Health - Occupational Stress Questionnaire    Feeling of Stress: Only a little  Social Connections: Socially Integrated (07/13/2024)   Social Connection and Isolation Panel    Frequency of Communication with Friends and Family: More than three times a week    Frequency of Social Gatherings with Friends and Family: More than three times a week    Attends Religious Services: More than 4  times per year    Active Member of [employer] or Organizations: Yes    Attends Engineer, structural: More than 4 times per year    Marital Status: Married  Catering manager Violence: Not on file    Review of Systems   Objective:   Vitals:   08/05/24 1052  BP: 118/78  Pulse: 74  Temp: 97.8 F (36.6 C)  TempSrc: Temporal  SpO2: 98%  Weight: 231 lb 12.8 oz (105.1 kg)  Height: 5' 8 (1.727 m)    Physical Exam Vitals reviewed.  Constitutional:      General: She is not in acute distress.    Appearance: She is well-developed.  HENT:     Head: Normocephalic and atraumatic.     Right Ear: Hearing, tympanic membrane,  ear canal and external ear normal.     Left Ear: Hearing, tympanic membrane, ear canal and external ear normal.     Nose: Nose normal.     Comments: Tenderness with percussion of maxillary sinuses bilaterally.    Mouth/Throat:     Pharynx: No posterior oropharyngeal erythema.  Eyes:     Conjunctiva/sclera: Conjunctivae normal.     Pupils: Pupils are equal, round, and reactive to light.  Cardiovascular:     Rate and Rhythm: Normal rate and regular rhythm.     Heart sounds: Normal heart sounds. No murmur heard.    Comments: No ectopy. Pulmonary:     Effort: Pulmonary effort is normal. No respiratory distress.     Breath sounds: Normal breath sounds. No wheezing or rhonchi.  Skin:    General: Skin is warm and dry.     Findings: No rash.  Neurological:     General: No focal deficit present.     Mental Status: She is alert and oriented to person, place, and time.     Comments: No focal deficit, equal extremity strength.  1-2 beats of horizontal nystagmus with looking left, less with looking right.  Reproducible vertigo with leftward neck rotation.  Psychiatric:        Mood and Affect: Mood normal.        Behavior: Behavior normal.    Results for orders placed or performed in visit on 08/05/24  POC COVID-19 BinaxNow   Collection Time: 08/05/24 11:09 AM   Result Value Ref Range   SARS Coronavirus 2 Ag Negative Negative  POCT Influenza A/B   Collection Time: 08/05/24 11:14 AM  Result Value Ref Range   Influenza A, POC Negative Negative   Influenza B, POC Negative Negative     Assessment & Plan:  Kamica Florance is a 48 y.o. female . Sinus pressure - Plan: POC COVID-19 BinaxNow, POCT Influenza A/B  Acute maxillary sinusitis, recurrence not specified - Plan: amoxicillin -clavulanate (AUGMENTIN ) 875-125 MG tablet  ETD (Eustachian tube dysfunction), left  Ear pressure, left  Vertigo - Plan: meclizine  (ANTIVERT ) 25 MG tablet  Malaise and fatigue - Plan: CBC, Basic metabolic panel with GFR, TSH  History of palpitations - Plan: CBC, Basic metabolic panel with GFR, TSH  Suspected persistent congestion with secondary sinusitis, likely contributing to some pressure within her left ear, eustachian tube dysfunction.  That may also be contributing to a peripheral vertigo with overall reassuring exam at this time.  Nonfocal neuroexam.  Vertigo symptoms have improved.  Start Augmentin  for suspected sinusitis, meclizine  as needed for vertigo, fluids, rest, and will check other labs above for your malaise, fatigue and prior palpitations but asymptomatic at present.  RTC/ER precautions given.  Meds ordered this encounter  Medications   amoxicillin -clavulanate (AUGMENTIN ) 875-125 MG tablet    Sig: Take 1 tablet by mouth 2 (two) times daily.    Dispense:  20 tablet    Refill:  0   meclizine  (ANTIVERT ) 25 MG tablet    Sig: Take 1 tablet (25 mg total) by mouth 3 (three) times daily as needed for dizziness.    Dispense:  60 tablet    Refill:  0   Patient Instructions  I suspect that you have a sinus infection from the persistent congestion, which also is likely impacting the eustachian tube, pressure in your ear and leading to vertigo.  That may also be contributing to your fatigue.  I will check some blood work today.  If heart palpitations return  please be seen.  Start Augmentin  for sinus infection, meclizine  for vertigo but glad to hear those symptoms are improving.  Try to get some rest this weekend, drink plenty of fluids.  Be seen if not improving into next week, sooner if worsening but I do not expect that to occur.  Hang in there.     Signed,   Reyes Pines, MD [facility], [facility] 08/05/24 11:55 AM

## 2024-08-08 ENCOUNTER — Telehealth: Payer: Self-pay

## 2024-08-08 MED ORDER — PREDNISONE 10 MG PO TABS: ORAL_TABLET | ORAL | 0 refills | Status: DC

## 2024-08-08 NOTE — Telephone Encounter (Signed)
**Note De-identified  [place] Obfuscation** Please advise 

## 2024-08-08 NOTE — Telephone Encounter (Signed)
 Prescription for Prednisone  sent to pharmacy

## 2024-08-08 NOTE — Telephone Encounter (Signed)
 LVM to call office.

## 2024-08-08 NOTE — Telephone Encounter (Signed)
 Copied from CRM #[id number]. Topic: Clinical - Medication Question >> Aug 08, 2024  8:42 AM Berneda FALCON wrote: Reason for CRM: Patient was seen last week for her vertigo but states that she would like a steroid or something called in for her to help break up the sinuses that is causing the vertigo for her please.  She would like to use: [id number] [address] [place], [place] - [address] [place] [address] Phone: [phone] Fax: [phone] Hours: Not open 24 hours  Patient callback is [phone] (home)

## 2024-08-11 ENCOUNTER — Encounter: Payer: Self-pay | Admitting: Family Medicine

## 2024-08-11 ENCOUNTER — Ambulatory Visit: Payer: Self-pay | Admitting: Family Medicine

## 2024-08-11 MED ORDER — ESCITALOPRAM OXALATE 20 MG PO TABS: 20.0000 mg | ORAL_TABLET | Freq: Every day | ORAL | 0 refills | Status: AC

## 2024-08-11 MED ORDER — METHYLPHENIDATE HCL ER (OSM) 27 MG PO TBCR: 27.0000 mg | EXTENDED_RELEASE_TABLET | Freq: Two times a day (BID) | ORAL | 0 refills | Status: DC

## 2024-08-26 ENCOUNTER — Encounter: Payer: Self-pay | Admitting: Student in an Organized Health Care Education/Training Program

## 2024-08-26 ENCOUNTER — Ambulatory Visit: Admitting: Student in an Organized Health Care Education/Training Program

## 2024-08-26 ENCOUNTER — Ambulatory Visit: Payer: Self-pay

## 2024-08-26 VITALS — BP 124/72 | HR 74 | Temp 98.7°F | Resp 16 | Ht 68.0 in | Wt 233.2 lb

## 2024-08-26 DIAGNOSIS — J069 Acute upper respiratory infection, unspecified: Secondary | ICD-10-CM

## 2024-08-26 LAB — POCT RAPID STREP A (OFFICE): Rapid Strep A Screen: NEGATIVE

## 2024-08-26 LAB — POC COVID19 BINAXNOW: SARS Coronavirus 2 Ag: NEGATIVE

## 2024-08-26 NOTE — Telephone Encounter (Signed)
 Patient has acute visit today with Dr Jerrell

## 2024-08-26 NOTE — Progress Notes (Signed)
   Acute Office Visit  Subjective:     Patient ID: Jenny Little, female    DOB: [birth year], 48 y.o.   MRN: [id number]  Chief Complaint  Patient presents with   Sore Throat    Patient complains of sore throat since yesterday.    Fatigue    Feeling fatigue    HPI  Discussed the use of AI scribe software for clinical note transcription with the patient, who gave verbal consent to proceed.  History of Present Illness Jenny Little is a 48 year old female who presents with a sore throat and feeling unwell after exposure to COVID-19 and strep throat.  Her symptoms began today, characterized by a sore throat and a general feeling of malaise.  Her 74 year old son, who lives with her, was recently diagnosed with COVID-19, strep throat, an ear infection, and an eye infection. She had recently traveled together to [place]  for a [employer].  No fever today, but her son has had a fever. No ear pain, although she has been experiencing some vertigo since a previous illness three weeks ago, which was treated with amoxicillin  and prednisone  for a sinus infection and vertigo.  She describes her current condition as feeling like a head cold, with no significant cough, chest pain, or shortness of breath.  Her current medications include Crestor , Concerta , and Albuterol . 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      Objective:    BP 124/72 (BP Location: Right Arm, Patient Position: Sitting, Cuff Size: Normal)   Pulse 74   Temp 98.7 F (37.1 C) (Oral)   Resp 16   Ht 5' 8 (1.727 m)   Wt 233 lb 3.7 oz (105.8 kg)   SpO2 99%   BMI 35.46 kg/m   Physical Exam  Gen: Well-appearing woman Ears: Normal tympanic membrane on the right, small erythema of the tympanic membrane on the left without posterior effusion, no bulging, normal light reflex Mouth: Unable to  visualize the posterior oropharynx due to high tongue Neck: Mild tenderness in the left submandibular space, but no lymphadenopathy, no masses, no edema Heart: Regular, no murmur Lungs: Unlabored, clear throughout with no wheezing or crackles     Assessment & Plan:   Problem List Items Addressed This Visit       Unprioritized   Viral URI - Primary   Acute problem.  Symptoms most consistent with an upper respiratory tract infection, likely viral etiology.  Had recent exposure to COVID and her son had strep throat.  We tested for those with point-of-care swabs and they were negative.  Her exam is reassuring.  No signs of otitis media, sinusitis, or pneumonia on exam.  Think there is any benefit to antibiotics.  I recommended supportive care including ibuprofen 400 mg twice daily and Tylenol  1000 mg twice daily as needed.  Very likely to resolve with supportive care in the coming 3-5 days.      Relevant Orders   POC COVID-19 BinaxNow   POCT rapid strep A    Return if symptoms worsen or fail to improve.  Cleatus Debby Specking, MD

## 2024-08-26 NOTE — Assessment & Plan Note (Signed)
 Acute problem.  Symptoms most consistent with an upper respiratory tract infection, likely viral etiology.  Had recent exposure to COVID and her son had strep throat.  We tested for those with point-of-care swabs and they were negative.  Her exam is reassuring.  No signs of otitis media, sinusitis, or pneumonia on exam.  Think there is any benefit to antibiotics.  I recommended supportive care including ibuprofen 400 mg twice daily and Tylenol  1000 mg twice daily as needed.  Very likely to resolve with supportive care in the coming 3-5 days.

## 2024-08-26 NOTE — Patient Instructions (Addendum)
   VISIT SUMMARY: Today, you were seen for a sore throat and feeling unwell after being exposed to COVID-19 and strep throat. Your symptoms started today and include a sore throat and general malaise. Your son, who lives with you, was recently diagnosed with multiple infections, including COVID-19 and strep throat. You have no fever today but have been experiencing some vertigo since a previous illness three weeks ago. Your current medications include Crestor , Concerta , and Albuterol . 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  YOUR PLAN: -VIRAL UPPER RESPIRATORY INFECTION: You have an acute viral upper respiratory infection, likely caused by the common cold virus (rhinovirus). Your COVID-19 and strep tests were negative. This infection is contagious for two to three days and should resolve in three to five days with supportive care. You can take ibuprofen and acetaminophen  to relieve your symptoms. Please be aware that you are most contagious in the first two to three days.  -VERTIGO: You have been experiencing vertigo, which is a sensation of spinning or dizziness. This started after a previous illness that was treated with amoxicillin  and prednisone  for a sinus infection. There is no current sinus pain.  INSTRUCTIONS: Please follow up if your symptoms worsen or do not improve within a few days. Continue taking your current medications as prescribed. If you experience any new or worsening symptoms, contact our office.

## 2024-08-26 NOTE — Telephone Encounter (Signed)
 FYI Only or Action Required?: FYI only for provider.  Patient was last seen in primary care on 08/05/2024 by Levora Reyes SAUNDERS, MD.  Called Nurse Triage reporting Sore Throat.  Symptoms began yesterday.  Interventions attempted: Nothing.  Symptoms are: unchanged.  Triage Disposition: Call PCP Within 24 Hours  Patient/caregiver understands and will follow disposition?: Yes Reason for Disposition  [1] Exposure to family member (or spouse or boyfriend/girlfriend) with test-proven strep AND [2] within last 10 days  Answer Assessment - Initial Assessment Questions Son has covid, strep, eye infection, ear infection.  1. ONSET: When did the throat start hurting? (Hours or days ago)      Yesterday  2. SEVERITY: How bad is the sore throat? (Scale 1-10; mild, moderate or severe)     Moderate  3. STREP EXPOSURE: Has there been any exposure to strep within the past week? If Yes, ask: What type of contact occurred?      Yes, son diagnosed yesterday  4.  VIRAL SYMPTOMS: Are there any symptoms of a cold, such as a runny nose, cough, hoarse voice or red eyes?      Runny nose  5. FEVER: Do you have a fever? If Yes, ask: What is your temperature, how was it measured, and when did it start?     No  6. PUS ON THE TONSILS: Is there pus on the tonsils in the back of your throat?     No  7. OTHER SYMPTOMS: Do you have any other symptoms? (e.g., difficulty breathing, headache, rash)     Weak, fatigue.  Protocols used: Sore Throat-A-AH  Copied from CRM #[id number]. Topic: Clinical - Red Word Triage >> Aug 26, 2024 10:48 AM Suzen RAMAN wrote: Red Word that prompted transfer to Nurse Triage: swollen throat,congestion,fatigue

## 2024-08-30 ENCOUNTER — Encounter: Payer: Self-pay | Admitting: Student in an Organized Health Care Education/Training Program

## 2024-08-30 NOTE — Telephone Encounter (Signed)
 Patient husband will be coming to pick up letter for patient. Letter is placed in file folder up front

## 2024-08-30 NOTE — Telephone Encounter (Signed)
 Patient is requesting a Dr.notes for being out sick this week ( Monday and today). Patient was seen on 10/03 and you stated in your notes that Very likely to resolve with supportive care in the coming 3-5 days. Are you okay with writing patient a note or would you like patient to come in to be seen again since this does not seem be improving?

## 2024-08-30 NOTE — Telephone Encounter (Signed)
 Letter printed and placed in MA basket.

## 2024-09-07 ENCOUNTER — Other Ambulatory Visit: Payer: Self-pay | Admitting: Family Medicine

## 2024-09-07 DIAGNOSIS — E785 Hyperlipidemia, unspecified: Secondary | ICD-10-CM

## 2024-09-11 ENCOUNTER — Encounter: Payer: Self-pay | Admitting: Family Medicine

## 2024-09-12 MED ORDER — METHYLPHENIDATE HCL ER (OSM) 27 MG PO TBCR: 27.0000 mg | EXTENDED_RELEASE_TABLET | Freq: Two times a day (BID) | ORAL | 0 refills | Status: DC

## 2024-09-12 NOTE — Addendum Note (Signed)
 Addended by: Suki Crockett E on: 09/12/2024 04:10 PM   Modules accepted: Orders

## 2024-10-10 ENCOUNTER — Telehealth: Payer: Self-pay

## 2024-10-10 MED ORDER — METHYLPHENIDATE HCL ER (OSM) 27 MG PO TBCR: 27.0000 mg | EXTENDED_RELEASE_TABLET | Freq: Two times a day (BID) | ORAL | 0 refills | Status: DC

## 2024-10-10 NOTE — Telephone Encounter (Signed)
 Telephone encounter created and sent to provider

## 2024-10-10 NOTE — Telephone Encounter (Signed)
 Medication: Concerta  Directions:  Last given:  Number refills:  Last o/v:  Follow up:  Labs:    I do not see this medication in her med list. Please advise.

## 2024-10-10 NOTE — Telephone Encounter (Signed)
 Prescription sent as requested.

## 2024-10-12 ENCOUNTER — Other Ambulatory Visit: Payer: Self-pay | Admitting: Family Medicine

## 2024-10-12 NOTE — Telephone Encounter (Unsigned)
 Copied from CRM [id number]. Topic: Clinical - Medication Refill >> Oct 12, 2024 11:08 AM Sophia H wrote: Medication: methylphenidate  (CONCERTA ) 27 MG PO CR tablet   Has the patient contacted their pharmacy? Yes, updated RX   This is the patient's preferred pharmacy:  [id number] [address] [place], [place] - [address] [place] [address] Phone: [phone] Fax: [phone]  Is this the correct pharmacy for this prescription? Yes If no, delete pharmacy and type the correct one.   Has the prescription been filled recently? Yes  Is the patient out of the medication? Yes  Has the patient been seen for an appointment in the last year OR does the patient have an upcoming appointment? Yes, seen back in Sept   Can we respond through MyChart? Yes  Agent: Please be advised that Rx refills may take up to 3 business days. We ask that you follow-up with your pharmacy.

## 2024-10-13 NOTE — Telephone Encounter (Signed)
 Requested Prescriptions   Pending Prescriptions Disp Refills   methylphenidate  (CONCERTA ) 27 MG PO CR tablet 60 tablet 0    Sig: Take 1 tablet (27 mg total) by mouth 2 (two) times daily. Take 1 upon waking and a 2nd early afternoon for additional/extended focus     Date of patient request: 09/2024 Last office visit: 01/29/2024 Upcoming visit: Visit date not found Date of last refill: 10/10/2024 Last refill amount: 30

## 2024-10-26 ENCOUNTER — Encounter: Payer: Self-pay | Admitting: Family Medicine

## 2024-10-27 NOTE — Telephone Encounter (Signed)
 FYI

## 2024-11-07 ENCOUNTER — Encounter: Payer: Self-pay | Admitting: Family Medicine

## 2024-11-07 MED ORDER — METHYLPHENIDATE HCL ER (OSM) 27 MG PO TBCR: 27.0000 mg | EXTENDED_RELEASE_TABLET | Freq: Two times a day (BID) | ORAL | 0 refills | Status: DC

## 2024-11-07 NOTE — Telephone Encounter (Signed)
 Requested Prescriptions   Pending Prescriptions Disp Refills   methylphenidate  (CONCERTA ) 27 MG PO CR tablet 60 tablet 0    Sig: Take 1 tablet (27 mg total) by mouth 2 (two) times daily. Take 1 upon waking and a 2nd early afternoon for additional/extended focus     Date of patient request: 11/07/24 Last office visit: 10/12/2024 Upcoming visit: Visit date not found Date of last refill: 10/10/24 Last refill amount: 60

## 2024-11-30 ENCOUNTER — Encounter: Payer: Self-pay | Admitting: Family Medicine

## 2024-11-30 ENCOUNTER — Telehealth: Admitting: Family Medicine

## 2024-11-30 DIAGNOSIS — F902 Attention-deficit hyperactivity disorder, combined type: Secondary | ICD-10-CM | POA: Diagnosis not present

## 2024-11-30 MED ORDER — METHYLPHENIDATE HCL ER (OSM) 27 MG PO TBCR: 27.0000 mg | EXTENDED_RELEASE_TABLET | Freq: Every day | ORAL | 0 refills | Status: DC

## 2024-11-30 MED ORDER — METHYLPHENIDATE HCL ER (OSM) 36 MG PO TBCR: 36.0000 mg | EXTENDED_RELEASE_TABLET | Freq: Every day | ORAL | 0 refills | Status: AC

## 2024-11-30 NOTE — Progress Notes (Signed)
 "   Virtual Visit via Video   I connected with patient on 11/30/2024 at  9:20 AM EST by a video enabled telemedicine application and verified that I am speaking with the correct person using two identifiers.  Location patient: Home Location provider: [facility] Persons participating in the virtual visit: Patient, Provider, CMA Montie B)  I discussed the limitations of evaluation and management by telemedicine and the availability of in person appointments. The patient expressed understanding and agreed to proceed.  Subjective:   HPI:   ADHD- ongoing issue.  Currently on Concerta  27mg - taking 1 in the morning, 1 at lunch, and 1 at 3pm during the work week.  Reports if she doesn't take the extra dose, medication wears off and she struggles in the afternoon.  ROS:   See pertinent positives and negatives per HPI.  Patient Active Problem List   Diagnosis Date Noted   Viral URI 08/26/2024   Vitamin D  deficiency 10/23/2020   Generalized anxiety disorder 05/15/2016   Irregular menses 05/15/2016   FH: colon cancer 02/25/2016   Anemia 11/13/2015   Attention deficit hyperactivity disorder (ADHD) 10/25/2015   Breast cancer screening 10/25/2015   Encounter for preventive health examination 10/25/2015   Obesity (BMI 30.0-34.9) 10/25/2015    Social History   Tobacco Use   Smoking status: Never   Smokeless tobacco: Never  Substance Use Topics   Alcohol use: No   Current Medications[1]  Allergies[2]  Objective:   There were no vitals taken for this visit.  AAOx3, NAD NCAT, EOMI No obvious CN deficits Coloring WNL Pt is able to speak clearly, coherently without shortness of breath or increased work of breathing.  Thought process is linear.  Mood is appropriate.   Assessment and Plan:   ADHD- ongoing issue for pt.  Afternoon/evening sxs are not well controlled and she has had to increase her dose during the work week to TID.  Will increase AM dose to 36mg   daily and continue 27mg  in the afternoon.  Pt expressed understanding and is in agreement w/ plan.   Comer Greet, MD 11/30/2024       [1]  Current Outpatient Medications:    albuterol  (VENTOLIN  HFA) 108 (90 Base) MCG/ACT inhaler, Inhale 2 puffs into the lungs every 6 (six) hours as needed for wheezing or shortness of breath., Disp: 8 g, Rfl: 0   Albuterol -Budesonide (AIRSUPRA ) 90-80 MCG/ACT AERO, Inhale 2 puffs into the lungs every 4 (four) hours as needed., Disp: 17.7 g, Rfl: 1   Cyanocobalamin  (VITAMIN B 12 PO), Take by mouth., Disp: , Rfl:    escitalopram  (LEXAPRO ) 20 MG tablet, Take 1 tablet (20 mg total) by mouth daily. Take 20 mg by mouth daily., Disp: 90 tablet, Rfl: 0   fluticasone  (FLONASE ) 50 MCG/ACT nasal spray, Place 2 sprays into both nostrils daily., Disp: 16 g, Rfl: 6   meclizine  (ANTIVERT ) 25 MG tablet, Take 1 tablet (25 mg total) by mouth 3 (three) times daily as needed for dizziness., Disp: 60 tablet, Rfl: 0   methylphenidate  (CONCERTA ) 27 MG PO CR tablet, Take 1 tablet (27 mg total) by mouth 2 (two) times daily. Take 1 upon waking and a 2nd early afternoon for additional/extended focus, Disp: 60 tablet, Rfl: 0   ondansetron  (ZOFRAN ) 4 MG tablet, Take 1 tablet (4 mg total) by mouth every 8 (eight) hours as needed for nausea or vomiting., Disp: 30 tablet, Rfl: 1   rosuvastatin  (CRESTOR ) 20 MG tablet, TAKE 1 TABLET BY MOUTH EVERY  DAY, Disp: 90 tablet, Rfl: 1   Vitamin D , Ergocalciferol , (DRISDOL ) 1.25 MG (50000 UNIT) CAPS capsule, TAKE 1 CAPSULE (50,000 UNITS TOTAL) BY MOUTH EVERY 7 (SEVEN) DAYS, Disp: 4 capsule, Rfl: 2 [2] No Known Allergies  "

## 2024-12-12 ENCOUNTER — Encounter: Payer: Self-pay | Admitting: Family Medicine

## 2024-12-12 MED ORDER — METHYLPHENIDATE HCL ER (OSM) 27 MG PO TBCR: 27.0000 mg | EXTENDED_RELEASE_TABLET | Freq: Every day | ORAL | 0 refills | Status: AC
